# Patient Record
Sex: Female | Born: 1945 | Race: Black or African American | Hispanic: No | Smoking: Never smoker
Health system: Southern US, Community
[De-identification: ages and names within clinical notes are randomized; demographics above are authoritative.]

## PROBLEM LIST (undated history)

## (undated) DIAGNOSIS — M5416 Radiculopathy, lumbar region: Secondary | ICD-10-CM

## (undated) DIAGNOSIS — R519 Headache, unspecified: Secondary | ICD-10-CM

## (undated) DIAGNOSIS — E119 Type 2 diabetes mellitus without complications: Secondary | ICD-10-CM

## (undated) DIAGNOSIS — I10 Essential (primary) hypertension: Secondary | ICD-10-CM

## (undated) DIAGNOSIS — R51 Headache: Secondary | ICD-10-CM

## (undated) DIAGNOSIS — K5790 Diverticulosis of intestine, part unspecified, without perforation or abscess without bleeding: Secondary | ICD-10-CM

## (undated) DIAGNOSIS — G8929 Other chronic pain: Secondary | ICD-10-CM

## (undated) DIAGNOSIS — M549 Dorsalgia, unspecified: Secondary | ICD-10-CM

## (undated) HISTORY — PX: COLONOSCOPY: SHX174

## (undated) HISTORY — PX: ABDOMINAL HYSTERECTOMY: SHX81

## (undated) HISTORY — PX: THROAT SURGERY: SHX803

---

## 2003-12-19 ENCOUNTER — Emergency Department (HOSPITAL_COMMUNITY): Admission: EM | Admit: 2003-12-19 | Discharge: 2003-12-19 | Payer: Self-pay | Admitting: Emergency Medicine

## 2004-08-28 ENCOUNTER — Emergency Department (HOSPITAL_COMMUNITY): Admission: EM | Admit: 2004-08-28 | Discharge: 2004-08-28 | Payer: Self-pay | Admitting: Emergency Medicine

## 2004-09-22 ENCOUNTER — Ambulatory Visit (HOSPITAL_COMMUNITY): Admission: RE | Admit: 2004-09-22 | Discharge: 2004-09-22 | Payer: Self-pay | Admitting: Family Medicine

## 2004-09-26 ENCOUNTER — Encounter: Payer: Self-pay | Admitting: Orthopedic Surgery

## 2004-10-07 ENCOUNTER — Encounter: Payer: Self-pay | Admitting: Orthopedic Surgery

## 2010-08-19 ENCOUNTER — Telehealth: Payer: Self-pay

## 2010-08-19 DIAGNOSIS — Z139 Encounter for screening, unspecified: Secondary | ICD-10-CM

## 2010-08-19 NOTE — Telephone Encounter (Signed)
OK for colonoscopy w/ RMR 

## 2010-08-19 NOTE — Telephone Encounter (Signed)
Gastroenterology Pre-Procedure Form  Request Date: 09/03/2009    Requesting Physician: Ninfa Linden, FNP     PATIENT INFORMATION:  Angela Grimes is a 65 y.o., female (DOB=[birth year]).  PROCEDURE: Procedure(s) requested: colonoscopy Procedure Reason: screening for colon cancer  PATIENT REVIEW QUESTIONS: The patient reports the following:   1. Diabetes Melitis: no 2. Joint replacements in the past 12 months: no 3. Major health problems in the past 3 months: no 4. Has an artificial valve or MVP:no 5. Has been advised in past to take antibiotics in advance of a procedure like teeth cleaning: no}     MEDICATIONS & ALLERGIES:    Patient reports the following regarding taking any blood thinners:  Plavix? no ASA  Yes Coumadin?  no  Patient confirms/reports the following medications:  Current Outpatient Prescriptions  Medication Sig Dispense Refill  . aspirin 81 MG tablet Take 81 mg by mouth daily.        Marland Kitchen losartan (COZAAR) 100 MG tablet Take 100 mg by mouth daily.          Patient confirms/reports the following allergies:  No Known Allergies  Patient is appropriate to schedule for requested procedure(s): yes  AUTHORIZATION INFORMATION Primary Insurance: Medicare     ID #: [id number] Pre-Cert / Nelda Bucks Insurance: [employer]   ID #: [id number]    Pre-Cert / Berkley Harvey required: Pre-Cert / Auth #:   Orders Placed This Encounter  Procedures  . Endoscopy, colon, diagnostic    Standing Status: Future     Number of Occurrences:      Standing Expiration Date: 08/19/2011    Order Specific Question:  Pre-op diagnosis    Answer:  Screening colonoscopy    Order Specific Question:  Pre-op visit required?    Answer:  No [0]    SCHEDULE INFORMATION: Procedure has been scheduled as follows:  Date: 08/26/2010   Time: 10:15 AM  Location: [facility] Short Stay  This Gastroenterology Pre-Precedure Form is being routed to the following provider(s) for review: R. Roetta Sessions, MD

## 2010-08-26 ENCOUNTER — Other Ambulatory Visit: Payer: Self-pay | Admitting: Internal Medicine

## 2010-08-26 ENCOUNTER — Ambulatory Visit (HOSPITAL_COMMUNITY)
Admission: RE | Admit: 2010-08-26 | Discharge: 2010-08-26 | Disposition: A | Discharge status: Home / Self Care | Payer: Medicare PPO | Source: Ambulatory Visit | Attending: Internal Medicine | Admitting: Internal Medicine

## 2010-08-26 ENCOUNTER — Encounter: Payer: Medicare PPO | Admitting: Internal Medicine

## 2010-08-26 DIAGNOSIS — Z79899 Other long term (current) drug therapy: Secondary | ICD-10-CM | POA: Insufficient documentation

## 2010-08-26 DIAGNOSIS — Z1211 Encounter for screening for malignant neoplasm of colon: Secondary | ICD-10-CM

## 2010-08-26 DIAGNOSIS — D126 Benign neoplasm of colon, unspecified: Secondary | ICD-10-CM | POA: Insufficient documentation

## 2010-08-26 DIAGNOSIS — I10 Essential (primary) hypertension: Secondary | ICD-10-CM | POA: Insufficient documentation

## 2010-08-26 DIAGNOSIS — K5730 Diverticulosis of large intestine without perforation or abscess without bleeding: Secondary | ICD-10-CM

## 2010-08-26 DIAGNOSIS — Z7982 Long term (current) use of aspirin: Secondary | ICD-10-CM | POA: Insufficient documentation

## 2010-08-26 DIAGNOSIS — K644 Residual hemorrhoidal skin tags: Secondary | ICD-10-CM | POA: Insufficient documentation

## 2010-09-01 ENCOUNTER — Encounter: Payer: Self-pay | Admitting: Internal Medicine

## 2010-09-22 NOTE — Op Note (Signed)
  NAME:  GEARLDINE, LOONEY                 ACCOUNT NO.:  [id number]  MEDICAL RECORD NO.:  [id number]  LOCATION:  [facility]                          FACILITY:  [facility]  PHYSICIAN:  R. Roetta Sessions, MD FACP FACGDATE OF BIRTH:  11-02-1945  DATE OF PROCEDURE: DATE OF DISCHARGE:                              OPERATIVE REPORT   PROCEDURE:  Colonoscopy with snare polypectomy.  INDICATIONS FOR PROCEDURE:  A 65 year old lady referred at the courtesy of Ninfa Linden, FNP for her first ever screening colonoscopy.  No lower GI tract symptoms.  No family history of polyps or colon cancer. Colonoscopy is now being done as screening maneuver.  Risks, benefits, limitations, alternatives, imponderables have been reviewed, questions answered.  All parties agreeable.  PROCEDURE NOTE:  O2 saturation, blood pressure, pulse, respirations were monitored throughout the entirety of the procedure.  CONSCIOUS SEDATION:  Versed 5 mg IV, Demerol 75 mg IV in divided doses.  INSTRUMENT:  Pentax video chip system.  FINDINGS:  Digital rectal exam revealed only one external hemorrhoidal tag, otherwise negative.  Endoscopic findings:  Prep was adequate. Colon:  Colonic mucosa was surveyed from the rectosigmoid junction through the left transverse right colon to the appendiceal orifice, ileocecal valve/cecum.  Cecum was deeply intubated.  Cecum, ileocecal valve, appendiceal orifice were well seen and  photographed for the record.  From this level, scope was slowly withdrawn.  All previously mentioned mucosal surfaces were again seen.  The patient was found to have a 1-cm pedunculated polyp opposite the ileocecal valve which was hot snared, recovered through the scope.  The patient had scattered left- sided diverticula.  The remainder of colonic mucosa appeared normal. Scope was pulled down the rectum where a thorough examination of rectal mucosa including retroflexed view of the anal verge demonstrated no abnormalities.   The patient tolerated the procedure well.  Cecal withdrawal time 11 minutes.  IMPRESSION: 1. Single external hemorrhoidal tag otherwise normal rectum. 2. Left-sided diverticula, pedunculated polyp opposite the ileocecal     valve status post hot snare polypectomy.  RECOMMENDATIONS: 1. Diverticulosis and polyp literature provided to Ms. Orren. 2. No aspirin or arthritis medications for 5 days. 3. Followup on path. 4. Further recommendations to follow.     Jonathon Bellows, MD Caleen Essex     RMR/MEDQ  D:  08/26/2010  T:  08/26/2010  Job:  [id number]  cc:   Ninfa Linden, FNP Fax: [phone]  Electronically Signed by Lorrin Goodell M.D. on 09/22/2010 09:19:11 AM

## 2013-08-03 ENCOUNTER — Encounter (HOSPITAL_COMMUNITY): Payer: Self-pay | Admitting: Emergency Medicine

## 2013-08-03 ENCOUNTER — Emergency Department (HOSPITAL_COMMUNITY): Payer: Medicare PPO

## 2013-08-03 ENCOUNTER — Emergency Department (HOSPITAL_COMMUNITY)
Admission: EM | Admit: 2013-08-03 | Discharge: 2013-08-04 | Disposition: A | Discharge status: Home / Self Care | Payer: Medicare PPO | Attending: Emergency Medicine | Admitting: Emergency Medicine

## 2013-08-03 DIAGNOSIS — E119 Type 2 diabetes mellitus without complications: Secondary | ICD-10-CM | POA: Insufficient documentation

## 2013-08-03 DIAGNOSIS — R519 Headache, unspecified: Secondary | ICD-10-CM

## 2013-08-03 DIAGNOSIS — Z79899 Other long term (current) drug therapy: Secondary | ICD-10-CM | POA: Insufficient documentation

## 2013-08-03 DIAGNOSIS — Z7982 Long term (current) use of aspirin: Secondary | ICD-10-CM | POA: Insufficient documentation

## 2013-08-03 DIAGNOSIS — R51 Headache: Secondary | ICD-10-CM | POA: Insufficient documentation

## 2013-08-03 DIAGNOSIS — I10 Essential (primary) hypertension: Secondary | ICD-10-CM | POA: Insufficient documentation

## 2013-08-03 HISTORY — DX: Essential (primary) hypertension: I10

## 2013-08-03 HISTORY — DX: Type 2 diabetes mellitus without complications: E11.9

## 2013-08-03 NOTE — ED Notes (Signed)
I woke up this morning around 8 am with a pain in the top of my head. Localized to one area, strange sensation per pt. Denies numbness, no nausea per pt.

## 2013-08-03 NOTE — ED Notes (Signed)
Pt reports a nagging pain that has been on & off all day. Pt rates pain at current a 2/10. Pt WNL for neuro assessment

## 2013-08-03 NOTE — ED Provider Notes (Signed)
CSN: [id number]     Arrival date & time 08/03/13  2236 History   First MD Initiated Contact with Patient 08/03/13 2318 This chart was scribed for Johnna Acosta, MD by Anastasia Pall, ED Scribe. This patient was seen in room APA14/[facility]14 and the patient's care was started at 11:26 PM.     Chief Complaint  Patient presents with  . Head Pain    (Consider location/radiation/quality/duration/timing/severity/associated sxs/prior Treatment) The history is provided by the patient. No language interpreter was used.   HPI Comments: Angela Grimes is a 68 y.o. female who presents to the Emergency Department complaining of constant, dull, throbbing headache, onset 8:00 AM this morning. She denies her pain radiating to her neck or shoulders. She denies h/o similar pain. She states her mother had h/o atraumatic ICH from brain aneurysm that burst causing death in the past. She denies blurry vision, nausea, palpitations, difficulty breathing, dysuria, diarrhea, rashes, difficulty walking, loss of coordination, recent head trauma, MVCs, and new medications. Nothing makes this better or worse.  She states that the sx are mild - she classifies this as a dull throbbing but declines pain medicine stating that it is uncomfortable but no painful.  She is very concerned about aneurysm given her family history of brain aneurysm.  PCP - Erma Pinto, FNP  Past Medical History  Diagnosis Date  . Diabetes mellitus without complication   . Hypertension    Past Surgical History  Procedure Laterality Date  . Abdominal hysterectomy     No family history on file. History  Substance Use Topics  . Smoking status: Never Smoker   . Smokeless tobacco: Not on file  . Alcohol Use: No   OB History   Grav Para Term Preterm Abortions TAB SAB Ect Mult Living                 Review of Systems  All other systems reviewed and are negative.  Allergies  Review of patient's allergies indicates no known allergies.  Home  Medications   Prior to Admission medications   Medication Sig Start Date End Date Taking? Authorizing Provider  aspirin 81 MG tablet Take 81 mg by mouth daily.     Yes Historical Provider, MD  losartan (COZAAR) 100 MG tablet Take 100 mg by mouth daily.     Yes Historical Provider, MD   BP 160/87  Pulse 77  Temp(Src) 98.2 F (36.8 C) (Oral)  Resp 18  Ht 5\' 3"  (1.6 m)  Wt 194 lb (87.998 kg)  BMI 34.37 kg/m2  SpO2 95% Physical Exam  Nursing note and vitals reviewed. Constitutional: She is oriented to person, place, and time. She appears well-developed and well-nourished. No distress.  HENT:  Head: Normocephalic and atraumatic.  Mouth/Throat: Oropharynx is clear and moist.  Eyes: Conjunctivae and EOM are normal. Pupils are equal, round, and reactive to light.  Neck: Neck supple. No tracheal deviation present.  Cardiovascular: Normal rate, regular rhythm, normal heart sounds and intact distal pulses.  Exam reveals no gallop and no friction rub.   No murmur heard. Pulmonary/Chest: Effort normal and breath sounds normal. No respiratory distress. She has no wheezes. She has no rales.  Musculoskeletal: Normal range of motion. She exhibits no edema and no tenderness.  Neurological: She is alert and oriented to person, place, and time.  Neurologic exam:  Speech clear, pupils equal round reactive to light, extraocular movements intact  Normal peripheral visual fields Cranial nerves III through XII normal including no facial  droop Follows commands, moves all extremities x4, normal strength to bilateral upper and lower extremities at all major muscle groups including grip Sensation normal to light touch and pinprick Coordination intact, no limb ataxia, finger-nose-finger normal Rapid alternating movements normal No pronator drift Gait normal   Skin: Skin is warm and dry.  Psychiatric: She has a normal mood and affect. Her behavior is normal.   ED Course  Procedures (including critical  care time)  DIAGNOSTIC STUDIES: Oxygen Saturation is 95% on room air, adequate by my interpretation.    COORDINATION OF CARE: 11:30 PM-Discussed treatment plan with pt at bedside and pt agreed to plan.   Labs Review Labs Reviewed - No data to display  Imaging Review Ct Head Wo Contrast  08/04/2013   CLINICAL DATA:  Strange sensation at the crown of the head.  EXAM: CT HEAD WITHOUT CONTRAST  TECHNIQUE: Contiguous axial images were obtained from the base of the skull through the vertex without intravenous contrast.  COMPARISON:  None.  FINDINGS: There is no evidence of acute infarction, mass lesion, or intra- or extra-axial hemorrhage on CT.  The posterior fossa, including the cerebellum, brainstem and fourth ventricle, is within normal limits. The third and lateral ventricles, and basal ganglia are unremarkable in appearance. The cerebral hemispheres are symmetric in appearance, with normal gray-white differentiation. No mass effect or midline shift is seen.  There is no evidence of fracture; visualized osseous structures are unremarkable in appearance. The visualized portions of the orbits are within normal limits. The paranasal sinuses and mastoid air cells are well-aerated. No significant soft tissue abnormalities are seen.  IMPRESSION: Unremarkable noncontrast CT of the head.   Electronically Signed   By: Garald Balding M.D.   On: 08/04/2013 01:35     Medications - No data to display MDM   Final diagnoses:  Headache    The pt has uncomfortable sensation that is very concerning to her - at this time a CT of the head without contrast will be performed to look for possible bleeding though I think this is less likely - she appears very comfortable, she has no focal neurological findings and otherwise appears benign.  She declines pain medicine on initial evaluation.  CT normal, stable for d/c   I personally performed the services described in this documentation, which was scribed in my  presence. The recorded information has been reviewed and is accurate.      Johnna Acosta, MD 08/04/13 [phone]

## 2013-08-04 NOTE — Discharge Instructions (Signed)
CT scan is normal! No signs of bleeding - an MRI is required to fully evaluate for aneurysm.  Take tylenol for mild pains.

## 2013-08-04 NOTE — ED Notes (Signed)
Pt alert & oriented x4, stable gait. Patient given discharge instructions, paperwork & prescription(s). Patient  instructed to stop at the registration desk to finish any additional paperwork. Patient verbalized understanding. Pt left department w/ no further questions. 

## 2013-12-31 ENCOUNTER — Encounter (HOSPITAL_COMMUNITY): Payer: Self-pay | Admitting: Emergency Medicine

## 2013-12-31 ENCOUNTER — Emergency Department (HOSPITAL_COMMUNITY)
Admission: EM | Admit: 2013-12-31 | Discharge: 2013-12-31 | Disposition: A | Discharge status: Home / Self Care | Payer: Medicare PPO | Attending: Emergency Medicine | Admitting: Emergency Medicine

## 2013-12-31 DIAGNOSIS — H1132 Conjunctival hemorrhage, left eye: Secondary | ICD-10-CM | POA: Diagnosis not present

## 2013-12-31 DIAGNOSIS — H578 Other specified disorders of eye and adnexa: Secondary | ICD-10-CM | POA: Diagnosis present

## 2013-12-31 DIAGNOSIS — I10 Essential (primary) hypertension: Secondary | ICD-10-CM | POA: Diagnosis not present

## 2013-12-31 DIAGNOSIS — Z7982 Long term (current) use of aspirin: Secondary | ICD-10-CM | POA: Diagnosis not present

## 2013-12-31 DIAGNOSIS — R05 Cough: Secondary | ICD-10-CM | POA: Insufficient documentation

## 2013-12-31 DIAGNOSIS — R51 Headache: Secondary | ICD-10-CM | POA: Insufficient documentation

## 2013-12-31 DIAGNOSIS — Z79899 Other long term (current) drug therapy: Secondary | ICD-10-CM | POA: Insufficient documentation

## 2013-12-31 DIAGNOSIS — E119 Type 2 diabetes mellitus without complications: Secondary | ICD-10-CM | POA: Insufficient documentation

## 2013-12-31 MED ORDER — ERYTHROMYCIN 5 MG/GM OP OINT: TOPICAL_OINTMENT | OPHTHALMIC | Status: DC

## 2013-12-31 NOTE — Discharge Instructions (Signed)
Subconjunctival Hemorrhage °A subconjunctival hemorrhage is a bright red patch covering a portion of the white of the eye. The white part of the eye is called the sclera, and it is covered by a thin membrane called the conjunctiva. This membrane is clear, except for tiny blood vessels that you can see with the naked eye. When your eye is irritated or inflamed and becomes red, it is because the vessels in the conjunctiva are swollen. °Sometimes, a blood vessel in the conjunctiva can break and bleed. When this occurs, the blood builds up between the conjunctiva and the sclera, and spreads out to create a red area. The red spot may be very small at first. It may then spread to cover a larger part of the surface of the eye, or even all of the visible white part of the eye. °In almost all cases, the blood will go away and the eye will become white again. Before completely dissolving, however, the red area may spread. It may also become brownish-yellow in color before going away. If a lot of blood collects under the conjunctiva, it may look like a bulge on the surface of the eye. This looks scary, but it will also eventually flatten out and go away. Subconjunctival hemorrhages do not cause pain, but if swollen, may cause a feeling of irritation. There is no effect on vision.  °CAUSES  °· The most common cause is mild trauma (rubbing the eye, irritation). °· Subconjunctival hemorrhages can happen because of coughing or straining (lifting heavy objects), vomiting, or sneezing. °· In some cases, your doctor may want to check your blood pressure. High blood pressure can also cause a subconjunctival hemorrhage. °· Severe trauma or blunt injuries. °· Diseases that affect blood clotting (hemophilia, leukemia). °· Abnormalities of blood vessels behind the eye (carotid cavernous sinus fistula). °· Tumors behind the eye. °· Certain drugs (aspirin, Coumadin, heparin). °· Recent eye surgery. °HOME CARE INSTRUCTIONS  °· Do not worry  about the appearance of your eye. You may continue your usual activities. °· Often, follow-up is not necessary. °SEEK MEDICAL CARE IF:  °· Your eye becomes painful. °· The bleeding does not disappear within 3 weeks. °· Bleeding occurs elsewhere, for example, under the skin, in the mouth, or in the other eye. °· You have recurring subconjunctival hemorrhages. °SEEK IMMEDIATE MEDICAL CARE IF:  °· Your vision changes or you have difficulty seeing. °· You develop a severe headache, persistent vomiting, confusion, or abnormal drowsiness (lethargy). °· Your eye seems to bulge or protrude from the eye socket. °· You notice the sudden appearance of bruises or have spontaneous bleeding elsewhere on your body. °Document Released: 02/23/2005 Document Revised: 07/10/2013 Document Reviewed: 01/21/2009 °Exit[employer]® Patient Information ©2015 [employer]. This information is not intended to replace advice given to you by your health care provider. Make sure you discuss any questions you have with your health care provider. ° °

## 2013-12-31 NOTE — ED Provider Notes (Signed)
CSN: [id number]     Arrival date & time 12/31/13  7106 History   This chart was scribed for Dorie Rank, MD by Hilda Lias, ED Scribe. This patient was seen in room APA04/APA04 and the patient's care was started at 7:54 AM.  The history is provided by the patient. No language interpreter was used.    HPI Comments: Angela Grimes is a 68 y.o. female who presents to the Emergency Department complaining of constant eye irritation and redness that began 2 days ago. Pt notes two nights ago she had a bad cough and headache after which redness began after the coughing. Pt notes having cold-like symptoms recently. Pt denies using contacts. Pt is not a smoker, does not drink, and does not do any illegal drugs. She denies eye pain, eye drainage, or fever. No headache now.  Her vision is normal. Past Medical History  Diagnosis Date  . Diabetes mellitus without complication   . Hypertension    Past Surgical History  Procedure Laterality Date  . Abdominal hysterectomy     No family history on file. History  Substance Use Topics  . Smoking status: Never Smoker   . Smokeless tobacco: Not on file  . Alcohol Use: No   OB History   Grav Para Term Preterm Abortions TAB SAB Ect Mult Living                 Review of Systems  Constitutional: Negative for fever.  Eyes: Positive for redness. Negative for pain and discharge.  Respiratory: Positive for cough.   Neurological: Positive for headaches.    A complete 10 system review of systems was obtained and all systems are negative except as noted in the HPI and PMH.    Allergies  Review of patient's allergies indicates no known allergies.  Home Medications   Prior to Admission medications   Medication Sig Start Date End Date Taking? Authorizing Provider  aspirin 81 MG tablet Take 81 mg by mouth daily.      Historical Provider, MD  losartan (COZAAR) 100 MG tablet Take 100 mg by mouth daily.      Historical Provider, MD   There were no vitals  taken for this visit. Physical Exam  Nursing note and vitals reviewed. Constitutional: She appears well-developed and well-nourished. No distress.  HENT:  Head: Normocephalic and atraumatic.  Right Ear: External ear normal.  Left Ear: External ear normal.  Eyes: EOM are normal. Right eye exhibits no chemosis, no discharge and no exudate. Left eye exhibits no chemosis, no discharge and no exudate. Right conjunctiva is not injected. Right conjunctiva has no hemorrhage. Left conjunctiva has a hemorrhage. No scleral icterus.  Neck: Neck supple. No tracheal deviation present.  Cardiovascular: Normal rate, regular rhythm and intact distal pulses.   Pulmonary/Chest: Effort normal and breath sounds normal. No stridor. No respiratory distress. She has no wheezes. She has no rales.  Abdominal: Soft. Bowel sounds are normal. She exhibits no distension. There is no tenderness. There is no rebound and no guarding.  Musculoskeletal: She exhibits no edema and no tenderness.  Neurological: She is alert. She has normal strength. No cranial nerve deficit (no facial droop, extraocular movements intact, no slurred speech) or sensory deficit. She exhibits normal muscle tone. She displays no seizure activity. Coordination normal.  Skin: Skin is warm and dry. No rash noted.  Psychiatric: She has a normal mood and affect.    ED Course  Procedures (including critical care time)  DIAGNOSTIC STUDIES:  Oxygen Saturation is 97% on RA, normal by my interpretation.    COORDINATION OF CARE: 7:59 AM Discussed treatment plan with pt at bedside and pt agreed to plan.   MDM   Final diagnoses:  Subconjunctival hemorrhage of left eye    The patient's exam is consistent with a subconjunctival hemorrhage. Most likely related to the coughing spell she had. Doubt iritis, conjunctivitis.  Opthalmic ointment for symptomatic relief.  I personally performed the services described in this documentation, which was scribed in my  presence.  The recorded information has been reviewed and is accurate.    Dorie Rank, MD 12/31/13 [phone]

## 2013-12-31 NOTE — ED Notes (Signed)
Patient c/o left eye irritation. Patient's sclera red. Denies pain or drainage. Per patient started redness started in corner of eye and progressively gotten worse. Patient denies any blurred vision.

## 2014-01-08 ENCOUNTER — Encounter (HOSPITAL_COMMUNITY): Payer: Self-pay | Admitting: Emergency Medicine

## 2014-05-09 ENCOUNTER — Emergency Department (HOSPITAL_COMMUNITY): Payer: Medicare HMO

## 2014-05-09 ENCOUNTER — Emergency Department (HOSPITAL_COMMUNITY)
Admission: EM | Admit: 2014-05-09 | Discharge: 2014-05-09 | Disposition: A | Discharge status: Home / Self Care | Payer: Medicare HMO | Attending: Emergency Medicine | Admitting: Emergency Medicine

## 2014-05-09 ENCOUNTER — Encounter (HOSPITAL_COMMUNITY): Payer: Self-pay | Admitting: Emergency Medicine

## 2014-05-09 DIAGNOSIS — R002 Palpitations: Secondary | ICD-10-CM | POA: Diagnosis not present

## 2014-05-09 DIAGNOSIS — Z8719 Personal history of other diseases of the digestive system: Secondary | ICD-10-CM | POA: Insufficient documentation

## 2014-05-09 DIAGNOSIS — Z79899 Other long term (current) drug therapy: Secondary | ICD-10-CM | POA: Insufficient documentation

## 2014-05-09 DIAGNOSIS — I10 Essential (primary) hypertension: Secondary | ICD-10-CM | POA: Diagnosis not present

## 2014-05-09 DIAGNOSIS — E119 Type 2 diabetes mellitus without complications: Secondary | ICD-10-CM | POA: Diagnosis not present

## 2014-05-09 DIAGNOSIS — G8929 Other chronic pain: Secondary | ICD-10-CM | POA: Insufficient documentation

## 2014-05-09 DIAGNOSIS — Z7982 Long term (current) use of aspirin: Secondary | ICD-10-CM | POA: Diagnosis not present

## 2014-05-09 DIAGNOSIS — Z8739 Personal history of other diseases of the musculoskeletal system and connective tissue: Secondary | ICD-10-CM | POA: Insufficient documentation

## 2014-05-09 HISTORY — DX: Headache, unspecified: R51.9

## 2014-05-09 HISTORY — DX: Radiculopathy, lumbar region: M54.16

## 2014-05-09 HISTORY — DX: Dorsalgia, unspecified: M54.9

## 2014-05-09 HISTORY — DX: Headache: R51

## 2014-05-09 HISTORY — DX: Other chronic pain: G89.29

## 2014-05-09 HISTORY — DX: Diverticulosis of intestine, part unspecified, without perforation or abscess without bleeding: K57.90

## 2014-05-09 LAB — BASIC METABOLIC PANEL
Anion gap: 6 (ref 5–15)
BUN: 17 mg/dL (ref 6–23)
CO2: 28 mmol/L (ref 19–32)
Calcium: 9.5 mg/dL (ref 8.4–10.5)
Chloride: 105 mmol/L (ref 96–112)
Creatinine, Ser: 0.78 mg/dL (ref 0.50–1.10)
GFR calc Af Amer: >90 mL/min (ref >90)
GFR calc non Af Amer: 83 mL/min — ABNORMAL LOW (ref >90)
Glucose, Bld: 136 mg/dL — ABNORMAL HIGH (ref 70–99)
Potassium: 4.2 mmol/L (ref 3.5–5.1)
Sodium: 139 mmol/L (ref 135–145)

## 2014-05-09 LAB — CBC WITH DIFFERENTIAL/PLATELET
Basophils Absolute: 0 10*3/uL (ref 0.0–0.1)
Basophils Relative: 0 % (ref 0–1)
Eosinophils Absolute: 0 10*3/uL (ref 0.0–0.7)
Eosinophils Relative: 0 % (ref 0–5)
HCT: 37.2 % (ref 36.0–46.0)
Hemoglobin: 11.8 g/dL — ABNORMAL LOW (ref 12.0–15.0)
Lymphocytes Relative: 20 % (ref 12–46)
Lymphs Abs: 1.8 10*3/uL (ref 0.7–4.0)
MCH: 27.2 pg (ref 26.0–34.0)
MCHC: 31.7 g/dL (ref 30.0–36.0)
MCV: 85.7 fL (ref 78.0–100.0)
Monocytes Absolute: 0.4 10*3/uL (ref 0.1–1.0)
Monocytes Relative: 5 % (ref 3–12)
Neutro Abs: 6.4 10*3/uL (ref 1.7–7.7)
Neutrophils Relative %: 75 % (ref 43–77)
Platelets: 363 10*3/uL (ref 150–400)
RBC: 4.34 MIL/uL (ref 3.87–5.11)
RDW: 14.3 % (ref 11.5–15.5)
WBC: 8.6 10*3/uL (ref 4.0–10.5)

## 2014-05-09 LAB — TROPONIN I: Troponin I: <0.03 ng/mL (ref <0.031)

## 2014-05-09 NOTE — Discharge Instructions (Signed)
°Emergency Department Resource Guide °1) Find a Doctor and Pay Out of Pocket °Although you won't have to find out who is covered by your insurance plan, it is a good idea to ask around and get recommendations. You will then need to call the office and see if the doctor you have chosen will accept you as a new patient and what types of options they offer for patients who are self-pay. Some doctors offer discounts or will set up payment plans for their patients who do not have insurance, but you will need to ask so you aren't surprised when you get to your appointment. ° °2) Contact Your Local Health Department °Not all health departments have doctors that can see patients for sick visits, but many do, so it is worth a call to see if yours does. If you don't know where your local health department is, you can check in your phone book. The CDC also has a tool to help you locate your state's health department, and many state websites also have listings of all of their local health departments. ° °3) Find a Walk-in Clinic °If your illness is not likely to be very severe or complicated, you may want to try a walk in clinic. These are popping up all over the country in pharmacies, drugstores, and shopping centers. They're usually staffed by nurse practitioners or physician assistants that have been trained to treat common illnesses and complaints. They're usually fairly quick and inexpensive. However, if you have serious medical issues or chronic medical problems, these are probably not your best option. ° °No Primary Care Doctor: °- Call [facility] at  832-8000 - they can help you locate a primary care doctor that  accepts your insurance, provides certain services, etc. °- Physician Referral Service- 1-800-533-3463 ° °Chronic Pain Problems: °Organization         Address  Phone   Notes  °[facility]  (336) 297-2271 Patients need to be referred by their primary care doctor.  ° °Medication  Assistance: °Organization         Address  Phone   Notes  °[place] [facility] [address] °[place], [place] [address] (336) 641-8030 --Must be a resident of [place] °-- Must have NO insurance coverage whatsoever (no Medicaid/ Medicare, etc.) °-- The pt. MUST have a primary care doctor that directs their care regularly and follows them in the community °  °[facility]  (866) 331-1348   °[employer]  (888) 892-1162   ° °Agencies that provide inexpensive medical care: °Organization         Address  Phone   Notes  °[facility]  (336) 832-8035   °[facility]    (336) 832-7272   °[facility] [address] °[place], [place] [address] (336) 832-4777   °[facility] [address], °[place] (336) 271-4999   °[facility]    (336) 373-0678   °[facility]    (336) 272-1050   °[facility] ° [address], [place] Phone:  (336) 832-4444, Fax:  (336) 832-4440 Hours of Operation:  9 am - 6 pm, M-F.  Also accepts Medicaid/Medicare and self-pay.  °[facility] ° [address],  Phone: (336) 832-3150, Fax: (336) 832-3151. Hours of Operation:  8:30 am - 5:30 pm, M-F.  Also accepts Medicaid and self-pay.  °[facility] [place] [address], [place] Phone: (336) 878-6027   °[facility] [address], [place], [place] (336)723-1848, Ext. 123 Mondays & Thursdays: 7-9 AM.  First 15 patients are seen on a first come, first serve basis. °  ° °Medicaid-accepting [place] Providers: ° °Organization         Address  Phone   Notes  °[facility] [address], [place] (336) 641-2100 Also accepts self-pay patients.  °[facility] [address], [place] ° (336) 856-9996   °[facility] [address], [place]  (336) 288-8857   °[facility] [address] [place] [address], [place] (336) 299-7000   °Veita Bland [address], [place]  ° (336) 373-1557 Only accepts [place] Access Medicaid patients after they have their name applied to their card.  ° °Self-Pay (no insurance) in [place]: ° °Organization         Address  Phone   Notes  °Sickle Cell Patients, [facility] [address], [place] (336) 832-1970   °[facility] [address], [place] (336) 832-4400   °[facility] [place] ° [address], [place] (336) 992-4800   °[facility]/Dr. Osei-Bonsu ° [address] [place] [address], [place] or [address], [place] (336) 841-8500 Phone number for both [place] and [place] locations is the same.  °[facility] [address], [place] (336) 299-0000   °[facility] [address] [place] [address], [place] or [address] (336) 852-7530 °(336) 878-2260   °[facility] [address], [place] (336) 350-1642, phone; (336) 294-5005, fax Sees patients 1st and 3rd Saturday of every month.  Must not qualify for public or private insurance (i.e. Medicaid, Medicare, [place] Health Choice, Veterans' Benefits) • Household income should be no more than 200% of the poverty level •The clinic cannot treat you if you are pregnant or think you are pregnant • Sexually transmitted diseases are not treated at the clinic.  ° ° °Dental Care: °Organization         Address  Phone  Notes  °[place] [facility] [address],  (336) 641-6152 Accepts children up to age 21 who are enrolled in Medicaid or [place] Health Choice; pregnant women with a Medicaid card; and children who have applied for Medicaid or [place] Health Choice, but were declined, whose parents can pay a reduced fee at time of service.  °[place]  [facility] [place]  [address], [place] (336) 641-7733 Accepts children up to age 21 who are enrolled in Medicaid or [place] Health Choice; pregnant women with a Medicaid card; and children who have applied for Medicaid or [place] Health Choice, but were declined, whose parents can pay a reduced fee at time of service.  °[facility] ° [address],  (336) 641-4533 Patients are seen by appointment only. Walk-ins are not accepted. [facility] will see patients 18 years of age and older. °Monday - Tuesday (8am-5pm) °Most Wednesdays (8:30-5pm) °$30 per visit, cash only  °[facility] ° [address], [place] (336) 641-4533 Patients are seen by appointment only. Walk-ins are not accepted. [facility] will see patients 18 years of age and older. °One   Wednesday Evening (Monthly: Volunteer Based).  $30 per visit, cash only  °[facility]  (919) 537-3737 for adults; Children under age 4, call Graduate Pediatric Dentistry at ([phone]) [phone]. Children aged 4-14, please call (919) [phone] to request a pediatric application. ° Dental services are provided in all areas of dental care including fillings, crowns and bridges, complete and partial dentures, implants, gum treatment, root canals, and extractions. Preventive care is also provided. Treatment is provided to both adults and children. °Patients are selected via a lottery and there is often a waiting list. °  °[facility] [address], °[place] ° (336) 763-8833 www.drcivils.com °  °[facility] [address], [place], [place] (336)723-1848, Ext. 123 Second and Fourth Thursday of each month, opens at 6:30 AM; Clinic ends at 9 AM.  Patients are seen on a first-come first-served basis, and a limited number are seen during each clinic.  ° °[facility] ° [address], [place], [place] (336) 723-7904    Eligibility Requirements °You must have lived in [place], [place], or [place] counties for at least the last three months. °  You cannot be eligible for state or federal sponsored healthcare insurance, including Veterans Administration, Medicaid, or Medicare. °  You generally cannot be eligible for healthcare insurance through your employer.  °  How to apply: °Eligibility screenings are held every Tuesday and Wednesday afternoon from 1:00 pm until 4:00 pm. You do not need an appointment for the interview!  °[facility] [address], [place], [place] 336-631-2330   °[facility]  336-342-8273   °[facility]  336-703-3100   °[facility]  336-570-6415   ° °Behavioral Health Resources in the Community: °Intensive Outpatient Programs °Organization         Address  Phone  Notes  °[facility] [address], [place], [place] 336-878-6098   °[facility] [address], [place], [place] 336-832-9800   °[facility] [address], [place], [place] ° 336-882-2125   °[facility] [address],  °[place], [place] 1-800-853-5163 or 336-641-4981   °Substance Abuse Resources °Organization         Address  Phone  Notes  °[facility]  336-882-2125   °[facility]  336-784-9470   °[facility]  336-285-9073   °[facility]  336-845-3988   °Residential & Outpatient Substance Abuse Program  1-800-659-3381   °Psychological Services °Organization         Address  Phone  Notes  °[facility]  336- 832-9600   °[facility]  336- 378-7881   °[facility] [address], [place] 1-800-853-5163 or 336-641-4981   ° °Mobile Crisis Teams °Organization         Address  Phone  Notes  °[facility], Mobile Crisis Care Unit  1-877-626-1772   °Assertive °[facility] ° [address]  [place], [place] 336-834-9664   °Sharon DeEsch [address] °[place] 336-554-5454   ° °Self-Help/Support Groups °Organization         Address  Phone             Notes  °[facility] - variety of support groups  336- 373-1402 Call for more information  °Narcotics Anonymous (NA), [facility] [address], °[place]   2 meetings at this location  ° °  Residential Treatment Programs Organization         Address  Phone  Notes  [facility] [address]    [place]  1-4380194982   [facility]  [address], [place] [address], [place], [place]   [place], [place] [phone] Admissions: 8am-3pm M-F  [facility] [place] [address]    [place], [place] 458-099-8338   [facility] [address] [place], [place], [place]   [facility] [address],  [place], [place]   [facility] Intensive Outpatient [address], [place], [place]   [facility] ([place]  [place], [place] 1-3430787325 or 414-209-9561   [facility] [address] [place], [place] Accepts Medicaid  [facility] [address],  [place] 1-848-365-7367 Substance Abuse/Addiction Treatment   [facility] Organization         Address  Phone  Notes  [facility]  585-438-2982   Domenic Schwab, PhD [address] [place], [place]   602-594-0543 or 825-194-6471   [place], [place] [phone]   [facility] [address], [place], [place] (725) 308-7603 Insurance/Medicaid/sponsorship through [facility] [address]                                    [place], [place] 971-626-0515 [place] [address], [place] ([phone]    Dr. Adele Schilder  ([phone]   [facility] Dept. 1) [address], [place] 2) [place] 3)  [address], [place] [phone]  [phone]   [place] (865)483-1734 or [phone] (After Hours)      Avoid avoid caffinated products, such as teas, colas, coffee, chocolate. Avoid over the counter cold medicines, herbal or "natural vitamin" products, and illicit drugs because they can contain stimulants.  Call your regular medical doctor and the Cardiologist tomorrow to schedule a follow up appointment within the next week.  Return to the Emergency Department immediately if worsening.

## 2014-05-09 NOTE — ED Provider Notes (Signed)
CSN: [id number]     Arrival date & time 05/09/14  1415 History   First MD Initiated Contact with Patient 05/09/14 1623     Chief Complaint  Patient presents with  . Palpitations     HPI Pt was seen at 1640. Per pt, c/o gradual onset and persistence of multiple intermittent episodes of "palpitations" for the past 3 months. Pt states they became worse "last week when I was visiting my daughter in the ICU." Pt describes the palpitations as "fast, pounding heart beat." Has been associated with "feeling like I'm having anxiety or a panic attack." Pt has not taken her heart rate during these episodes. States she had an episode PTA, but it "stopped on the way here."  Denies abd pain, no N/V/D, no SOB/cough, no CP, no syncope/near syncope, no focal motor weakness, no tingling/numbness in extremities.    Past Medical History  Diagnosis Date  . Diabetes mellitus without complication   . Hypertension   . Headache   . Diverticulosis   . Chronic back pain   . Lumbar radiculopathy    Past Surgical History  Procedure Laterality Date  . Abdominal hysterectomy    . Throat surgery     Family History  Problem Relation Age of Onset  . Stroke Mother   . Diabetes Other    History  Substance Use Topics  . Smoking status: Never Smoker   . Smokeless tobacco: Never Used  . Alcohol Use: No   OB History    Gravida Para Term Preterm AB TAB SAB Ectopic Multiple Living   9 8 8  1  1   7      Review of Systems ROS: Statement: All systems negative except as marked or noted in the HPI; Constitutional: Negative for fever and chills. ; ; Eyes: Negative for eye pain, redness and discharge. ; ; ENMT: Negative for ear pain, hoarseness, nasal congestion, sinus pressure and sore throat. ; ; Cardiovascular: +palpitations. Negative for chest pain, diaphoresis, dyspnea and peripheral edema. ; ; Respiratory: Negative for cough, wheezing and stridor. ; ; Gastrointestinal: Negative for nausea, vomiting, diarrhea,  abdominal pain, blood in stool, hematemesis, jaundice and rectal bleeding. . ; ; Genitourinary: Negative for dysuria, flank pain and hematuria. ; ; Musculoskeletal: Negative for back pain and neck pain. Negative for swelling and trauma.; ; Skin: Negative for pruritus, rash, abrasions, blisters, bruising and skin lesion.; ; Neuro: Negative for headache, lightheadedness and neck stiffness. Negative for weakness, altered level of consciousness , altered mental status, extremity weakness, paresthesias, involuntary movement, seizure and syncope.; Psych:  +anxiety. No SI, no SA, no HI, no hallucinations.      Allergies  Review of patient's allergies indicates no known allergies.  Home Medications   Prior to Admission medications   Medication Sig Start Date End Date Taking? Authorizing Provider  aspirin 81 MG tablet Take 81 mg by mouth daily.     Yes Historical Provider, MD  Calcium Carbonate Antacid (ANTACID PO) Take 1 tablet by mouth daily as needed (for acid reflux).   Yes Historical Provider, MD  losartan (COZAAR) 100 MG tablet Take 100 mg by mouth daily.     Yes Historical Provider, MD   BP 132/67 mmHg  Pulse 77  Temp(Src) 98.3 F (36.8 C) (Oral)  Resp 11  Ht 5\' 3"  (1.6 m)  Wt 180 lb (81.647 kg)  BMI 31.89 kg/m2  SpO2 100%   18:28 Orthostatic Vital Signs Orthostatic Lying  - BP- Lying: 137/68 mmHg ; Pulse-  Lying: 70  Orthostatic Sitting - BP- Sitting: 131/79 mmHg ; Pulse- Sitting: 74  Orthostatic Standing at 0 minutes - BP- Standing at 0 minutes: 128/78 mmHg ; Pulse- Standing at 0 minutes: 84      Physical Exam  1645: Physical examination:  Nursing notes reviewed; Vital signs and O2 SAT reviewed;  Constitutional: Well developed, Well nourished, Well hydrated, In no acute distress; Head:  Normocephalic, atraumatic; Eyes: EOMI, PERRL, No scleral icterus; ENMT: Mouth and pharynx normal, Mucous membranes moist; Neck: Supple, Full range of motion, No lymphadenopathy; Cardiovascular: Regular  rate and rhythm, No gallop; Respiratory: Breath sounds clear & equal bilaterally, No wheezes.  Speaking full sentences with ease, Normal respiratory effort/excursion; Chest: Nontender, Movement normal; Abdomen: Soft, Nontender, Nondistended, Normal bowel sounds; Genitourinary: No CVA tenderness; Extremities: Pulses normal, No tenderness, No edema, No calf edema or asymmetry.; Neuro: AA&Ox3, Major CN grossly intact.  Speech clear. No gross focal motor or sensory deficits in extremities.; Skin: Color normal, Warm, Dry.; Psych:  Anxious, tearful.    ED Course  Procedures     EKG Interpretation   Date/Time:  Wednesday May 09 2014 14:24:24 EST Ventricular Rate:  86 PR Interval:  160 QRS Duration: 82 QT Interval:  374 QTC Calculation: 447 R Axis:   57 Text Interpretation:  Normal sinus rhythm Normal ECG Confirmed by Lacinda Axon   MD, BRIAN ([id number]) on 05/09/2014 2:32:20 PM      MDM  MDM Reviewed: previous chart, nursing note and vitals Reviewed previous: labs and ECG Interpretation: labs, ECG and x-ray     Results for orders placed or performed during the hospital encounter of 05/09/14  CBC with Differential  Result Value Ref Range   WBC 8.6 4.0 - 10.5 K/uL   RBC 4.34 3.87 - 5.11 MIL/uL   Hemoglobin 11.8 (L) 12.0 - 15.0 g/dL   HCT 37.2 36.0 - 46.0 %   MCV 85.7 78.0 - 100.0 fL   MCH 27.2 26.0 - 34.0 pg   MCHC 31.7 30.0 - 36.0 g/dL   RDW 14.3 11.5 - 15.5 %   Platelets 363 150 - 400 K/uL   Neutrophils Relative % 75 43 - 77 %   Neutro Abs 6.4 1.7 - 7.7 K/uL   Lymphocytes Relative 20 12 - 46 %   Lymphs Abs 1.8 0.7 - 4.0 K/uL   Monocytes Relative 5 3 - 12 %   Monocytes Absolute 0.4 0.1 - 1.0 K/uL   Eosinophils Relative 0 0 - 5 %   Eosinophils Absolute 0.0 0.0 - 0.7 K/uL   Basophils Relative 0 0 - 1 %   Basophils Absolute 0.0 0.0 - 0.1 K/uL  Basic metabolic panel  Result Value Ref Range   Sodium 139 135 - 145 mmol/L   Potassium 4.2 3.5 - 5.1 mmol/L   Chloride 105 96 - 112 mmol/L    CO2 28 19 - 32 mmol/L   Glucose, Bld 136 (H) 70 - 99 mg/dL   BUN 17 6 - 23 mg/dL   Creatinine, Ser 0.78 0.50 - 1.10 mg/dL   Calcium 9.5 8.4 - 10.5 mg/dL   GFR calc non Af Amer 83 (L) >90 mL/min   GFR calc Af Amer >90 >90 mL/min   Anion gap 6 5 - 15  Troponin I  Result Value Ref Range   Troponin I <0.03 <0.031 ng/mL   Dg Chest 2 View 05/09/2014   CLINICAL DATA:  Palpitations and tachycardia.  EXAM: CHEST - 2 VIEW  COMPARISON:  None.  FINDINGS: The heart size is normal. Atherosclerotic calcifications are present at the aortic arch. The lungs are clear. Mild degenerative changes are noted at the [facility] joints.  IMPRESSION: 1. No acute cardiopulmonary disease. 2. Atherosclerosis.   Electronically Signed   By: San Morelle M.D.   On: 05/09/2014 17:32    1840:  Pt has ambulated with steady gait, resps easy, HR 88-92. Denies CP/palpitations. Pt is not orthostatic. No arrhythmia on monitor during ED visit.  Pt states she "feels better" and wants to go home now. Dx and testing d/w pt and family.  Questions answered.  Verb understanding, agreeable to d/c home with outpt f/u.   Francine Graven, DO 05/11/14 2340

## 2014-05-09 NOTE — ED Notes (Signed)
Pt reports onset of rapid heartbeat and palpitations this morning around 1100. Pt states symptoms have been intermittent. Pt denies CP, SOB or n/v.

## 2014-05-09 NOTE — ED Notes (Signed)
Pt ambulated well. HR:88-92 O2: 94-98

## 2014-07-05 ENCOUNTER — Encounter (HOSPITAL_COMMUNITY): Payer: Self-pay

## 2014-07-05 ENCOUNTER — Emergency Department (HOSPITAL_COMMUNITY)
Admission: EM | Admit: 2014-07-05 | Discharge: 2014-07-05 | Disposition: A | Discharge status: Home / Self Care | Payer: Medicare HMO | Attending: Emergency Medicine | Admitting: Emergency Medicine

## 2014-07-05 DIAGNOSIS — I10 Essential (primary) hypertension: Secondary | ICD-10-CM | POA: Insufficient documentation

## 2014-07-05 DIAGNOSIS — Z8719 Personal history of other diseases of the digestive system: Secondary | ICD-10-CM | POA: Insufficient documentation

## 2014-07-05 DIAGNOSIS — R42 Dizziness and giddiness: Secondary | ICD-10-CM

## 2014-07-05 DIAGNOSIS — Z7982 Long term (current) use of aspirin: Secondary | ICD-10-CM | POA: Insufficient documentation

## 2014-07-05 DIAGNOSIS — Z8739 Personal history of other diseases of the musculoskeletal system and connective tissue: Secondary | ICD-10-CM | POA: Diagnosis not present

## 2014-07-05 DIAGNOSIS — G8929 Other chronic pain: Secondary | ICD-10-CM | POA: Diagnosis not present

## 2014-07-05 DIAGNOSIS — Z79899 Other long term (current) drug therapy: Secondary | ICD-10-CM | POA: Diagnosis not present

## 2014-07-05 DIAGNOSIS — E119 Type 2 diabetes mellitus without complications: Secondary | ICD-10-CM | POA: Diagnosis not present

## 2014-07-05 DIAGNOSIS — R110 Nausea: Secondary | ICD-10-CM | POA: Insufficient documentation

## 2014-07-05 LAB — BASIC METABOLIC PANEL
Anion gap: 6 (ref 5–15)
BUN: 11 mg/dL (ref 6–23)
CO2: 28 mmol/L (ref 19–32)
Calcium: 8.8 mg/dL (ref 8.4–10.5)
Chloride: 105 mmol/L (ref 96–112)
Creatinine, Ser: 0.75 mg/dL (ref 0.50–1.10)
GFR calc Af Amer: >90 mL/min (ref >90)
GFR calc non Af Amer: 84 mL/min — ABNORMAL LOW (ref >90)
Glucose, Bld: 179 mg/dL — ABNORMAL HIGH (ref 70–99)
Potassium: 3.9 mmol/L (ref 3.5–5.1)
Sodium: 139 mmol/L (ref 135–145)

## 2014-07-05 LAB — CBC
HCT: 36.5 % (ref 36.0–46.0)
Hemoglobin: 11.5 g/dL — ABNORMAL LOW (ref 12.0–15.0)
MCH: 27.3 pg (ref 26.0–34.0)
MCHC: 31.5 g/dL (ref 30.0–36.0)
MCV: 86.7 fL (ref 78.0–100.0)
Platelets: 328 10*3/uL (ref 150–400)
RBC: 4.21 MIL/uL (ref 3.87–5.11)
RDW: 14.3 % (ref 11.5–15.5)
WBC: 7.1 10*3/uL (ref 4.0–10.5)

## 2014-07-05 LAB — CBG MONITORING, ED: Glucose-Capillary: 131 mg/dL — ABNORMAL HIGH (ref 70–99)

## 2014-07-05 MED ORDER — ONDANSETRON 8 MG PO TBDP: 8.0000 mg | ORAL_TABLET | Freq: Three times a day (TID) | ORAL | Status: DC | PRN

## 2014-07-05 MED ORDER — ONDANSETRON HCL 4 MG/2ML IJ SOLN
4.0000 mg | Freq: Once | INTRAMUSCULAR | Status: AC
  Administered 2014-07-05: 4 mg via INTRAVENOUS
  Filled 2014-07-05: qty 2

## 2014-07-05 MED ORDER — SODIUM CHLORIDE 0.9 % IV BOLUS (SEPSIS)
500.0000 mL | Freq: Once | INTRAVENOUS | Status: AC
  Administered 2014-07-05: 500 mL via INTRAVENOUS

## 2014-07-05 MED ORDER — DIAZEPAM 5 MG PO TABS
5.0000 mg | ORAL_TABLET | Freq: Once | ORAL | Status: AC
  Administered 2014-07-05: 5 mg via ORAL
  Filled 2014-07-05: qty 1

## 2014-07-05 MED ORDER — DIAZEPAM 5 MG PO TABS: 5.0000 mg | ORAL_TABLET | Freq: Three times a day (TID) | ORAL | Status: DC | PRN

## 2014-07-05 NOTE — ED Provider Notes (Signed)
CSN: [id number]     Arrival date & time 07/05/14  1725 History   First MD Initiated Contact with Patient 07/05/14 1734     Chief Complaint  Patient presents with  . Dizziness  . Nausea      The history is provided by the patient.   patient complains of a feeling of dizziness and the room spinning around which began abruptly at 4:00.  Made her nauseated.  She did not have any vomiting.  She denies weakness of her arms or legs.  No difficulty with her speech.  No slurred speech.  No abnormal facial symmetry noted by family.  Patient has a history of vertigo reports this feels similar.  She was unable to control her symptoms at home with meclizine and thus she came to the ER for evaluation.  She denies headache at this time.  Family reports no altered mental status.  She's been eating and drinking normally today week and has had no recent illness.    Past Medical History  Diagnosis Date  . Diabetes mellitus without complication   . Hypertension   . Headache   . Diverticulosis     pt denies  . Chronic back pain   . Lumbar radiculopathy    Past Surgical History  Procedure Laterality Date  . Abdominal hysterectomy    . Throat surgery     Family History  Problem Relation Age of Onset  . Stroke Mother   . Diabetes Other    History  Substance Use Topics  . Smoking status: Never Smoker   . Smokeless tobacco: Never Used  . Alcohol Use: No   OB History    Gravida Para Term Preterm AB TAB SAB Ectopic Multiple Living   9 8 8  1  1   7      Review of Systems  All other systems reviewed and are negative.     Allergies  Review of patient's allergies indicates no known allergies.  Home Medications   Prior to Admission medications   Medication Sig Start Date End Date Taking? Authorizing Provider  acetaminophen (TYLENOL) 500 MG tablet Take 500 mg by mouth every 6 (six) hours as needed.   Yes Historical Provider, MD  aspirin 81 MG tablet Take 81 mg by mouth daily.     Yes  Historical Provider, MD  Calcium Carbonate Antacid (ANTACID PO) Take 1 tablet by mouth daily as needed (for acid reflux).   Yes Historical Provider, MD  hydroxypropyl methylcellulose / hypromellose (ISOPTO TEARS / GONIOVISC) 2.5 % ophthalmic solution Place 1 drop into both eyes.   Yes Historical Provider, MD  losartan (COZAAR) 100 MG tablet Take 100 mg by mouth daily.     Yes Historical Provider, MD  metFORMIN (GLUCOPHAGE) 500 MG tablet Take 1 tablet by mouth daily. 06/26/14  Yes Historical Provider, MD                 BP 182/83 mmHg  Pulse 80  Temp(Src) 98.8 F (37.1 C) (Oral)  Resp 18  Ht 5\' 3"  (1.6 m)  Wt 186 lb (84.369 kg)  BMI 32.96 kg/m2  SpO2 100% Physical Exam  Constitutional: She is oriented to person, place, and time. She appears well-developed and well-nourished. No distress.  HENT:  Head: Normocephalic and atraumatic.  Eyes: EOM are normal. Pupils are equal, round, and reactive to light.  Neck: Normal range of motion.  Cardiovascular: Normal rate, regular rhythm and normal heart sounds.   Pulmonary/Chest: Effort normal and breath  sounds normal.  Abdominal: Soft. She exhibits no distension. There is no tenderness.  Musculoskeletal: Normal range of motion.  Neurological: She is alert and oriented to person, place, and time.  5/5 strength in major muscle groups of  bilateral upper and lower extremities. Speech normal. No facial asymetry.   Skin: Skin is warm and dry.  Psychiatric: She has a normal mood and affect. Judgment normal.  Nursing note and vitals reviewed.   ED Course  Procedures (including critical care time) Labs Review Labs Reviewed  CBC - Abnormal; Notable for the following:    Hemoglobin 11.5 (*)    All other components within normal limits  BASIC METABOLIC PANEL - Abnormal; Notable for the following:    Glucose, Bld 179 (*)    GFR calc non Af Amer 84 (*)    All other components within normal limits  CBG MONITORING, ED - Abnormal; Notable for the  following:    Glucose-Capillary 131 (*)    All other components within normal limits    Imaging Review No results found.   EKG Interpretation   Date/Time:  Thursday July 05 2014 17:40:32 EDT Ventricular Rate:  89 PR Interval:  149 QRS Duration: 83 QT Interval:  379 QTC Calculation: 461 R Axis:   45 Text Interpretation:  Sinus rhythm Atrial premature complex Borderline T  abnormalities, anterior leads Baseline wander in lead(s) I II III aVL aVF  V1 V4 V5 No significant change was found Confirmed by Crixus Mcaulay  MD, Lennette Bihari  ([id number]) on 07/05/2014 5:43:05 PM      MDM   Final diagnoses:  Vertigo    6:59 PM Patient feels much better after her Valium in the emergency department.  I suspect this is peripheral vertigo.  Doubt central vertigo.  Normal neurologic examination.  Close primary care follow-up.  Home with a short course of Valium when necessary dizziness.  Patient understands return to the emergency department for new or worsening symptoms.    Jola Schmidt, MD 07/05/14 1901

## 2014-07-05 NOTE — ED Notes (Signed)
Pt reports was playing a game on her tablet and at 4pm started feeling dizzy, nauseated, and had 1 episode of diarrhea.  Denies headache or extremity weakness or numbness.

## 2014-07-05 NOTE — ED Notes (Signed)
Pt reports possible vertigo in past 19-20 years ago

## 2014-07-05 NOTE — Discharge Instructions (Signed)

## 2015-02-11 ENCOUNTER — Encounter: Payer: Self-pay | Admitting: Internal Medicine

## 2015-02-27 ENCOUNTER — Ambulatory Visit: Payer: Medicare PPO | Admitting: Nurse Practitioner

## 2015-03-20 ENCOUNTER — Encounter: Payer: Self-pay | Admitting: Nurse Practitioner

## 2015-03-20 ENCOUNTER — Other Ambulatory Visit: Payer: Self-pay

## 2015-03-20 ENCOUNTER — Ambulatory Visit (INDEPENDENT_AMBULATORY_CARE_PROVIDER_SITE_OTHER): Payer: Medicare HMO | Admitting: Nurse Practitioner

## 2015-03-20 VITALS — BP 167/87 | HR 81 | Temp 98.3°F | Ht 63.0 in | Wt 182.4 lb

## 2015-03-20 DIAGNOSIS — Z1211 Encounter for screening for malignant neoplasm of colon: Secondary | ICD-10-CM | POA: Diagnosis not present

## 2015-03-20 DIAGNOSIS — Z86010 Personal history of colonic polyps: Secondary | ICD-10-CM

## 2015-03-20 MED ORDER — PEG 3350-KCL-NA BICARB-NACL 420 G PO SOLR: 4000.0000 mL | Freq: Once | ORAL | Status: DC

## 2015-03-20 NOTE — Progress Notes (Signed)
Primary Care Physician:  Petra Kuba, MD Primary Gastroenterologist:  Dr. Gala Romney  Chief Complaint  Patient presents with  . Colonoscopy    HPI:   70 year old female presents on follow-up from PCP for colonoscopy. PCP notes reviewed. Per PCPs notes last colonoscopy in January 2012 with a pedunculated polyp removed and next due in February 2017. Colonoscopy appears to been completed on 08/26/2010 which found diverticulosis, pedunculated polyp opposite the ileocecal valve status post polypectomy, single external hemorrhoid tag. Surgical pathology found the polyp to be tubular adenoma. Recommend 5 year repeat colonoscopy.  Today she states she's doing well overall. Denies abdominal pain, N/V, change in bowel habits, unintentional weight loss, hematochezia, melena, fever, chills. Denies chest pain, dyspnea, dizziness, lightheadedness, syncope, near syncope. Denies any other upper or lower GI symptoms.  Past Medical History  Diagnosis Date  . Diabetes mellitus without complication ([place])   . Hypertension   . Headache   . Diverticulosis     pt denies  . Chronic back pain   . Lumbar radiculopathy     Past Surgical History  Procedure Laterality Date  . Abdominal hysterectomy    . Throat surgery      Current Outpatient Prescriptions  Medication Sig Dispense Refill  . acetaminophen (TYLENOL) 500 MG tablet Take 500 mg by mouth every 6 (six) hours as needed.    Marland Kitchen aspirin 81 MG tablet Take 81 mg by mouth daily.      . Calcium Carbonate Antacid (ANTACID PO) Take 1 tablet by mouth daily as needed (for acid reflux).    . cetirizine (ZYRTEC) 10 MG tablet Take 10 mg by mouth as needed for allergies.    . hydroxypropyl methylcellulose / hypromellose (ISOPTO TEARS / GONIOVISC) 2.5 % ophthalmic solution Place 1 drop into both eyes.    Marland Kitchen losartan-hydrochlorothiazide (HYZAAR) 100-25 MG tablet Take 1 tablet by mouth daily.    . metFORMIN (GLUCOPHAGE) 500 MG tablet Take 1 tablet by mouth  daily.    . diazepam (VALIUM) 5 MG tablet Take 1 tablet (5 mg total) by mouth every 8 (eight) hours as needed (dizziness). (Patient not taking: Reported on 03/20/2015) 12 tablet 0  . losartan (COZAAR) 100 MG tablet Take 100 mg by mouth daily. Reported on 03/20/2015    . ondansetron (ZOFRAN ODT) 8 MG disintegrating tablet Take 1 tablet (8 mg total) by mouth every 8 (eight) hours as needed for nausea or vomiting. (Patient not taking: Reported on 03/20/2015) 12 tablet 0   No current facility-administered medications for this visit.    Allergies as of 03/20/2015  . (No Known Allergies)    Family History  Problem Relation Age of Onset  . Stroke Mother   . Diabetes Other     Social History   Social History  . Marital Status: Divorced    Spouse Name: N/A  . Number of Children: N/A  . Years of Education: N/A   Occupational History  . Not on file.   Social History Main Topics  . Smoking status: Never Smoker   . Smokeless tobacco: Never Used  . Alcohol Use: No  . Drug Use: No  . Sexual Activity: Not on file   Other Topics Concern  . Not on file   Social History Narrative    Review of Systems: General: Negative for anorexia, weight loss, fever, chills, fatigue, weakness. Eyes: Negative for vision changes.  ENT: Negative for hoarseness, difficulty swallowing. CV: Negative for chest pain, angina, palpitations, peripheral edema.  Respiratory: Negative  for dyspnea at rest, cough, sputum, wheezing.  GI: See history of present illness. Derm: Negative for rash or itching.  Endo: Negative for unusual weight change.  Heme: Negative for bruising or bleeding. Allergy: Negative for rash or hives.    Physical Exam: BP 167/87 mmHg  Pulse 81  Temp(Src) 98.3 F (36.8 C) (Oral)  Ht 5\' 3"  (1.6 m)  Wt 182 lb 6.4 oz (82.736 kg)  BMI 32.32 kg/m2 General:   Alert and oriented. Pleasant and cooperative. Well-nourished and well-developed.  Head:  Normocephalic and atraumatic. Eyes:   Without icterus, sclera clear and conjunctiva pink.  Ears:  Normal auditory acuity. Cardiovascular:  S1, S2 present without murmurs appreciated. Extremities without clubbing or edema. Respiratory:  Clear to auscultation bilaterally. No wheezes, rales, or rhonchi. No distress.  Gastrointestinal:  +BS, soft, non-tender and non-distended. No HSM noted. No guarding or rebound. No masses appreciated.  Rectal:  Deferred  Skin:  Intact without significant lesions or rashes. Neurologic:  Alert and oriented x4;  grossly normal neurologically. Psych:  Alert and cooperative. Normal mood and affect. Heme/Lymph/Immune: No excessive bruising noted.    03/20/2015 9:57 AM

## 2015-03-20 NOTE — Progress Notes (Signed)
cc'ed to pcp °

## 2015-03-20 NOTE — Assessment & Plan Note (Signed)
Patient with a history of a pedunculated polyp opposite the ileocecal valve on her last colonoscopy which was determined to be tubular adenoma on surgical pathology. No family history of colon cancer. She presents today for a 5 year repeat screening colonoscopy due to previous polyp found. Return for follow-up as needed

## 2015-03-20 NOTE — Patient Instructions (Signed)
1. We will schedule your procedure for you. 2. Further recommendations to be based on results of your procedure. 3. Return for follow-up as needed based on procedure recommendations or as needed for any stomach or colon symptoms.

## 2015-03-20 NOTE — Assessment & Plan Note (Signed)
Patient here for early interval screening colonoscopy at 5 years post-last colonoscopy due to tubular adenoma polyp found as noted above. She is generally asymptomatic from a GI standpoint. No red flag/warning signs or symptoms. At this point we'll move forward with her colonoscopy as recommended previously.  Proceed with TCS with Dr. Gala Romney in near future: the risks, benefits, and alternatives have been discussed with the patient in detail. The patient states understanding and desires to proceed.  The patient is not on any anticoagulants, anxiolytics, chronic pain medications, or antidepressants. Last procedure completed on conscious sedation in this should be adequate for this procedure as well.

## 2015-03-28 ENCOUNTER — Encounter (HOSPITAL_COMMUNITY)
Admission: RE | Disposition: A | Discharge status: Home / Self Care | Payer: Self-pay | Source: Ambulatory Visit | Attending: Internal Medicine

## 2015-03-28 ENCOUNTER — Ambulatory Visit (HOSPITAL_COMMUNITY)
Admission: RE | Admit: 2015-03-28 | Discharge: 2015-03-28 | Disposition: A | Discharge status: Home / Self Care | Payer: Medicare HMO | Source: Ambulatory Visit | Attending: Internal Medicine | Admitting: Internal Medicine

## 2015-03-28 ENCOUNTER — Encounter (HOSPITAL_COMMUNITY): Payer: Self-pay

## 2015-03-28 DIAGNOSIS — I10 Essential (primary) hypertension: Secondary | ICD-10-CM | POA: Insufficient documentation

## 2015-03-28 DIAGNOSIS — Z7984 Long term (current) use of oral hypoglycemic drugs: Secondary | ICD-10-CM | POA: Diagnosis not present

## 2015-03-28 DIAGNOSIS — Z79899 Other long term (current) drug therapy: Secondary | ICD-10-CM | POA: Insufficient documentation

## 2015-03-28 DIAGNOSIS — Z86010 Personal history of colonic polyps: Secondary | ICD-10-CM | POA: Insufficient documentation

## 2015-03-28 DIAGNOSIS — E119 Type 2 diabetes mellitus without complications: Secondary | ICD-10-CM | POA: Diagnosis not present

## 2015-03-28 DIAGNOSIS — Z1211 Encounter for screening for malignant neoplasm of colon: Secondary | ICD-10-CM | POA: Diagnosis not present

## 2015-03-28 DIAGNOSIS — Z7982 Long term (current) use of aspirin: Secondary | ICD-10-CM | POA: Insufficient documentation

## 2015-03-28 HISTORY — PX: COLONOSCOPY: SHX5424

## 2015-03-28 LAB — GLUCOSE, CAPILLARY: Glucose-Capillary: 88 mg/dL (ref 65–99)

## 2015-03-28 SURGERY — COLONOSCOPY
Anesthesia: Moderate Sedation

## 2015-03-28 MED ORDER — STERILE WATER FOR IRRIGATION IR SOLN
Status: DC | PRN
  Administered 2015-03-28: 2.5 mL

## 2015-03-28 MED ORDER — MIDAZOLAM HCL 5 MG/5ML IJ SOLN
INTRAMUSCULAR | Status: DC | PRN
  Administered 2015-03-28: 1 mg via INTRAVENOUS
  Administered 2015-03-28 (×2): 2 mg via INTRAVENOUS

## 2015-03-28 MED ORDER — MEPERIDINE HCL 100 MG/ML IJ SOLN
INTRAMUSCULAR | Status: AC
  Filled 2015-03-28: qty 2

## 2015-03-28 MED ORDER — ONDANSETRON HCL 4 MG/2ML IJ SOLN
INTRAMUSCULAR | Status: DC | PRN
  Administered 2015-03-28: 4 mg via INTRAVENOUS

## 2015-03-28 MED ORDER — SODIUM CHLORIDE 0.9 % IV SOLN
INTRAVENOUS | Status: DC
  Administered 2015-03-28: 13:00:00 via INTRAVENOUS

## 2015-03-28 MED ORDER — ONDANSETRON HCL 4 MG/2ML IJ SOLN
INTRAMUSCULAR | Status: AC
  Filled 2015-03-28: qty 2

## 2015-03-28 MED ORDER — MIDAZOLAM HCL 5 MG/5ML IJ SOLN
INTRAMUSCULAR | Status: AC
  Filled 2015-03-28: qty 10

## 2015-03-28 MED ORDER — MEPERIDINE HCL 100 MG/ML IJ SOLN
INTRAMUSCULAR | Status: DC | PRN
  Administered 2015-03-28 (×2): 25 mg via INTRAVENOUS

## 2015-03-28 NOTE — H&P (View-Only) (Signed)
Primary Care Physician:  Petra Kuba, MD Primary Gastroenterologist:  Dr. Gala Romney  Chief Complaint  Patient presents with  . Colonoscopy    HPI:   70 year old female presents on follow-up from PCP for colonoscopy. PCP notes reviewed. Per PCPs notes last colonoscopy in January 2012 with a pedunculated polyp removed and next due in February 2017. Colonoscopy appears to been completed on 08/26/2010 which found diverticulosis, pedunculated polyp opposite the ileocecal valve status post polypectomy, single external hemorrhoid tag. Surgical pathology found the polyp to be tubular adenoma. Recommend 5 year repeat colonoscopy.  Today she states she's doing well overall. Denies abdominal pain, N/V, change in bowel habits, unintentional weight loss, hematochezia, melena, fever, chills. Denies chest pain, dyspnea, dizziness, lightheadedness, syncope, near syncope. Denies any other upper or lower GI symptoms.  Past Medical History  Diagnosis Date  . Diabetes mellitus without complication ([place])   . Hypertension   . Headache   . Diverticulosis     pt denies  . Chronic back pain   . Lumbar radiculopathy     Past Surgical History  Procedure Laterality Date  . Abdominal hysterectomy    . Throat surgery      Current Outpatient Prescriptions  Medication Sig Dispense Refill  . acetaminophen (TYLENOL) 500 MG tablet Take 500 mg by mouth every 6 (six) hours as needed.    Marland Kitchen aspirin 81 MG tablet Take 81 mg by mouth daily.      . Calcium Carbonate Antacid (ANTACID PO) Take 1 tablet by mouth daily as needed (for acid reflux).    . cetirizine (ZYRTEC) 10 MG tablet Take 10 mg by mouth as needed for allergies.    . hydroxypropyl methylcellulose / hypromellose (ISOPTO TEARS / GONIOVISC) 2.5 % ophthalmic solution Place 1 drop into both eyes.    Marland Kitchen losartan-hydrochlorothiazide (HYZAAR) 100-25 MG tablet Take 1 tablet by mouth daily.    . metFORMIN (GLUCOPHAGE) 500 MG tablet Take 1 tablet by mouth  daily.    . diazepam (VALIUM) 5 MG tablet Take 1 tablet (5 mg total) by mouth every 8 (eight) hours as needed (dizziness). (Patient not taking: Reported on 03/20/2015) 12 tablet 0  . losartan (COZAAR) 100 MG tablet Take 100 mg by mouth daily. Reported on 03/20/2015    . ondansetron (ZOFRAN ODT) 8 MG disintegrating tablet Take 1 tablet (8 mg total) by mouth every 8 (eight) hours as needed for nausea or vomiting. (Patient not taking: Reported on 03/20/2015) 12 tablet 0   No current facility-administered medications for this visit.    Allergies as of 03/20/2015  . (No Known Allergies)    Family History  Problem Relation Age of Onset  . Stroke Mother   . Diabetes Other     Social History   Social History  . Marital Status: Divorced    Spouse Name: N/A  . Number of Children: N/A  . Years of Education: N/A   Occupational History  . Not on file.   Social History Main Topics  . Smoking status: Never Smoker   . Smokeless tobacco: Never Used  . Alcohol Use: No  . Drug Use: No  . Sexual Activity: Not on file   Other Topics Concern  . Not on file   Social History Narrative    Review of Systems: General: Negative for anorexia, weight loss, fever, chills, fatigue, weakness. Eyes: Negative for vision changes.  ENT: Negative for hoarseness, difficulty swallowing. CV: Negative for chest pain, angina, palpitations, peripheral edema.  Respiratory: Negative  for dyspnea at rest, cough, sputum, wheezing.  GI: See history of present illness. Derm: Negative for rash or itching.  Endo: Negative for unusual weight change.  Heme: Negative for bruising or bleeding. Allergy: Negative for rash or hives.    Physical Exam: BP 167/87 mmHg  Pulse 81  Temp(Src) 98.3 F (36.8 C) (Oral)  Ht 5\' 3"  (1.6 m)  Wt 182 lb 6.4 oz (82.736 kg)  BMI 32.32 kg/m2 General:   Alert and oriented. Pleasant and cooperative. Well-nourished and well-developed.  Head:  Normocephalic and atraumatic. Eyes:   Without icterus, sclera clear and conjunctiva pink.  Ears:  Normal auditory acuity. Cardiovascular:  S1, S2 present without murmurs appreciated. Extremities without clubbing or edema. Respiratory:  Clear to auscultation bilaterally. No wheezes, rales, or rhonchi. No distress.  Gastrointestinal:  +BS, soft, non-tender and non-distended. No HSM noted. No guarding or rebound. No masses appreciated.  Rectal:  Deferred  Skin:  Intact without significant lesions or rashes. Neurologic:  Alert and oriented x4;  grossly normal neurologically. Psych:  Alert and cooperative. Normal mood and affect. Heme/Lymph/Immune: No excessive bruising noted.    03/20/2015 9:57 AM

## 2015-03-28 NOTE — Discharge Instructions (Signed)
°  Colonoscopy Discharge Instructions  Read the instructions outlined below and refer to this sheet in the next few weeks. These discharge instructions provide you with general information on caring for yourself after you leave the hospital. Your doctor may also give you specific instructions. While your treatment has been planned according to the most current medical practices available, unavoidable complications occasionally occur. If you have any problems or questions after discharge, call Dr. Gala Romney at [phone]. ACTIVITY  You may resume your regular activity, but move at a slower pace for the next 24 hours.   Take frequent rest periods for the next 24 hours.   Walking will help get rid of the air and reduce the bloated feeling in your belly (abdomen).   No driving for 24 hours (because of the medicine (anesthesia) used during the test).    Do not sign any important legal documents or operate any machinery for 24 hours (because of the anesthesia used during the test).  NUTRITION  Drink plenty of fluids.   You may resume your normal diet as instructed by your doctor.   Begin with a light meal and progress to your normal diet. Heavy or fried foods are harder to digest and may make you feel sick to your stomach (nauseated).   Avoid alcoholic beverages for 24 hours or as instructed.  MEDICATIONS  You may resume your normal medications unless your doctor tells you otherwise.  WHAT YOU CAN EXPECT TODAY  Some feelings of bloating in the abdomen.   Passage of more gas than usual.   Spotting of blood in your stool or on the toilet paper.  IF YOU HAD POLYPS REMOVED DURING THE COLONOSCOPY:  No aspirin products for 7 days or as instructed.   No alcohol for 7 days or as instructed.   Eat a soft diet for the next 24 hours.  FINDING OUT THE RESULTS OF YOUR TEST Not all test results are available during your visit. If your test results are not back during the visit, make an appointment  with your caregiver to find out the results. Do not assume everything is normal if you have not heard from your caregiver or the medical facility. It is important for you to follow up on all of your test results.  SEEK IMMEDIATE MEDICAL ATTENTION IF:  You have more than a spotting of blood in your stool.   Your belly is swollen (abdominal distention).   You are nauseated or vomiting.   You have a temperature over 101.   You have abdominal pain or discomfort that is severe or gets worse throughout the day.   Consider having one more colonoscopy in 7 years of overall health permits.

## 2015-03-28 NOTE — Interval H&P Note (Signed)
History and Physical Interval Note:  03/28/2015 1:12 PM  Angela Grimes  has presented today for surgery, with the diagnosis of history of colon adenoma, screening colonoscopy  The various methods of treatment have been discussed with the patient and family. After consideration of risks, benefits and other options for treatment, the patient has consented to  Procedure(s) with comments: COLONOSCOPY (N/A) - 1200pm - moved to 1:30 - office to notify as a surgical intervention .  The patient's history has been reviewed, patient examined, no change in status, stable for surgery.  I have reviewed the patient's chart and labs.  Questions were answered to the patient's satisfaction.     Charliee Krenz  No change; surveillance TCS per plan.  The risks, benefits, limitations, alternatives and imponderables have been reviewed with the patient. Questions have been answered. All parties are agreeable.

## 2015-03-29 NOTE — Op Note (Signed)
[facility] [address] [place], [address]   COLONOSCOPY PROCEDURE REPORT  PATIENT: Angela Grimes, Angela Grimes  MR#: [id number] BIRTHDATE: 05/03/1947 , 67  yrs. old GENDER: female ENDOSCOPIST: R.  Garfield Cornea, MD FACP [facility] REFERRED [id number] Shade Flood, M.D. PROCEDURE DATE:  17-Apr-2015 PROCEDURE:   Ileo-colonoscopy, surveillance INDICATIONS:History of colonic adenoma. MEDICATIONS: Versed 5 mg IV and Demerol 50 mg IV in divided doses. Zofran 4 mg IV. ASA CLASS:       Class II  CONSENT: The risks, benefits, alternatives and imponderables including but not limited to bleeding, perforation as well as the possibility of a missed lesion have been reviewed.  The potential for biopsy, lesion removal, etc. have also been discussed. Questions have been answered.  All parties agreeable.  Please see the history and physical in the medical record for more information.  DESCRIPTION OF PROCEDURE:   After the risks benefits and alternatives of the procedure were thoroughly explained, informed consent was obtained.  The digital rectal exam revealed no abnormalities of the rectum.   The [id number]  endoscope was introduced through the anus and advanced to the terminal ileum which was intubated for a short distance. No adverse events experienced.   The quality of the prep was adequate  The instrument was then slowly withdrawn as the colon was fully examined. Estimated blood loss is zero unless otherwise noted in this procedure report.      COLON FINDINGS: Normal-appearing rectal mucosa.  Normal-appearing colonic mucosa.  The distal 5 cm of terminal ileal mucosa also appeared normal.  Retroflexed views revealed no abnormalities. .  Withdrawal time=9 minutes 0 seconds.  The scope was withdrawn and the procedure completed. COMPLICATIONS: There were no immediate complications.  ENDOSCOPIC IMPRESSION: Normal ileo- colonoscopy  RECOMMENDATIONS: Consider 1 more surveillance  colonoscopy in 7 years if overall health permits.  eSigned:  R. Garfield Cornea, MD Rosalita Chessman [facility] 2015-04-17 1:45 PM   cc:  CPT CODES: ICD CODES:  The ICD and CPT codes recommended by this software are interpretations from the data that the clinical staff has captured with the software.  The verification of the translation of this report to the ICD and CPT codes and modifiers is the sole responsibility of the health care institution and practicing physician where this report was generated.  [employer] [place]. will not be held responsible for the validity of the ICD and CPT codes included on this report.  AMA assumes no liability for data contained or not contained herein. CPT is a Designer, television/film set of the [employer].  PATIENT NAME:  [name], [name] MR#: [id number]

## 2015-04-02 ENCOUNTER — Encounter (HOSPITAL_COMMUNITY): Payer: Self-pay | Admitting: Internal Medicine

## 2015-06-13 ENCOUNTER — Other Ambulatory Visit: Payer: Self-pay | Admitting: Family Medicine

## 2015-06-14 ENCOUNTER — Other Ambulatory Visit: Payer: Self-pay | Admitting: Family Medicine

## 2015-06-14 DIAGNOSIS — Z13820 Encounter for screening for osteoporosis: Secondary | ICD-10-CM

## 2015-10-12 ENCOUNTER — Encounter (HOSPITAL_COMMUNITY): Payer: Self-pay | Admitting: Emergency Medicine

## 2015-10-12 ENCOUNTER — Emergency Department (HOSPITAL_COMMUNITY)
Admission: EM | Admit: 2015-10-12 | Discharge: 2015-10-12 | Disposition: A | Discharge status: Home / Self Care | Payer: Medicare HMO | Attending: Emergency Medicine | Admitting: Emergency Medicine

## 2015-10-12 ENCOUNTER — Emergency Department (HOSPITAL_COMMUNITY): Payer: Medicare HMO

## 2015-10-12 DIAGNOSIS — E119 Type 2 diabetes mellitus without complications: Secondary | ICD-10-CM | POA: Insufficient documentation

## 2015-10-12 DIAGNOSIS — Z7982 Long term (current) use of aspirin: Secondary | ICD-10-CM | POA: Diagnosis not present

## 2015-10-12 DIAGNOSIS — E876 Hypokalemia: Secondary | ICD-10-CM | POA: Diagnosis not present

## 2015-10-12 DIAGNOSIS — R002 Palpitations: Secondary | ICD-10-CM | POA: Insufficient documentation

## 2015-10-12 DIAGNOSIS — Z79899 Other long term (current) drug therapy: Secondary | ICD-10-CM | POA: Diagnosis not present

## 2015-10-12 DIAGNOSIS — I10 Essential (primary) hypertension: Secondary | ICD-10-CM | POA: Diagnosis not present

## 2015-10-12 LAB — CBC
HEMATOCRIT: 36.8 % (ref 36.0–46.0)
HEMOGLOBIN: 12 g/dL (ref 12.0–15.0)
MCH: 27.8 pg (ref 26.0–34.0)
MCHC: 32.6 g/dL (ref 30.0–36.0)
MCV: 85.4 fL (ref 78.0–100.0)
Platelets: 384 10*3/uL (ref 150–400)
RBC: 4.31 MIL/uL (ref 3.87–5.11)
RDW: 13.3 % (ref 11.5–15.5)
WBC: 8.2 10*3/uL (ref 4.0–10.5)

## 2015-10-12 LAB — BASIC METABOLIC PANEL
ANION GAP: 10 (ref 5–15)
BUN: 20 mg/dL (ref 6–20)
CHLORIDE: 100 mmol/L — AB (ref 101–111)
CO2: 24 mmol/L (ref 22–32)
Calcium: 9.2 mg/dL (ref 8.9–10.3)
Creatinine, Ser: 0.71 mg/dL (ref 0.44–1.00)
GFR calc Af Amer: >60 mL/min (ref >60)
GLUCOSE: 176 mg/dL — AB (ref 65–99)
POTASSIUM: 2.8 mmol/L — AB (ref 3.5–5.1)
Sodium: 134 mmol/L — ABNORMAL LOW (ref 135–145)

## 2015-10-12 LAB — HEPATIC FUNCTION PANEL
ALK PHOS: 97 U/L (ref 38–126)
ALT: 13 U/L — AB (ref 14–54)
AST: 16 U/L (ref 15–41)
Albumin: 4 g/dL (ref 3.5–5.0)
Bilirubin, Direct: <0.1 mg/dL — ABNORMAL LOW (ref 0.1–0.5)
TOTAL PROTEIN: 8.1 g/dL (ref 6.5–8.1)
Total Bilirubin: 0.4 mg/dL (ref 0.3–1.2)

## 2015-10-12 LAB — TROPONIN I: Troponin I: <0.03 ng/mL (ref <0.03)

## 2015-10-12 MED ORDER — POTASSIUM CHLORIDE CRYS ER 20 MEQ PO TBCR: 20.0000 meq | EXTENDED_RELEASE_TABLET | Freq: Every day | ORAL | 0 refills | Status: DC

## 2015-10-12 MED ORDER — POTASSIUM CHLORIDE CRYS ER 20 MEQ PO TBCR
40.0000 meq | EXTENDED_RELEASE_TABLET | Freq: Once | ORAL | Status: AC
  Administered 2015-10-12: 40 meq via ORAL
  Filled 2015-10-12: qty 2

## 2015-10-12 MED ORDER — LORAZEPAM 2 MG/ML IJ SOLN
1.0000 mg | Freq: Once | INTRAMUSCULAR | Status: AC
  Administered 2015-10-12: 1 mg via INTRAVENOUS
  Filled 2015-10-12: qty 1

## 2015-10-12 MED ORDER — METOPROLOL SUCCINATE ER 25 MG PO TB24: ORAL_TABLET | ORAL | 0 refills | Status: AC

## 2015-10-12 MED ORDER — METOPROLOL TARTRATE 25 MG PO TABS
25.0000 mg | ORAL_TABLET | Freq: Once | ORAL | Status: DC
  Filled 2015-10-12: qty 1

## 2015-10-12 NOTE — ED Provider Notes (Signed)
[facility] DEPT Provider Note   CSN: [id number] Arrival date & time: 10/12/15  0700  First Provider Contact:  None       History   Chief Complaint Chief Complaint  Patient presents with  . Palpitations    HPI Angela Grimes is a 70 y.o. female.  Patient complains of palpitations today no chest pain   The history is provided by the patient. No language interpreter was used.  Palpitations   This is a new problem. The current episode started 12 to 24 hours ago. The problem occurs constantly. The problem has not changed since onset.The problem is associated with anxiety. Pertinent negatives include no diaphoresis, no chest pain, no abdominal pain, no headaches, no back pain and no cough.    Past Medical History:  Diagnosis Date  . Chronic back pain   . Diabetes mellitus without complication ([place])   . Diverticulosis    pt denies  . Headache   . Hypertension   . Lumbar radiculopathy     Patient Active Problem List   Diagnosis Date Noted  . History of adenomatous polyp of colon 03/20/2015  . Encounter for screening colonoscopy 03/20/2015    Past Surgical History:  Procedure Laterality Date  . ABDOMINAL HYSTERECTOMY    . COLONOSCOPY     2012: left-side diverticula, single pedunculated polyp tubular adenoma  . COLONOSCOPY N/A 03/28/2015   Procedure: COLONOSCOPY;  Surgeon: Daneil Dolin, MD;  Location: [facility];  Service: Endoscopy;  Laterality: N/A;  1200pm - moved to 1:30 - office to notify  . THROAT SURGERY      OB History    Gravida Para Term Preterm AB Living   9 8 8   1 7    SAB TAB Ectopic Multiple Live Births   1               Home Medications    Prior to Admission medications   Medication Sig Start Date End Date Taking? Authorizing Provider  acetaminophen (TYLENOL) 500 MG tablet Take 500 mg by mouth every 6 (six) hours as needed for mild pain.     Historical Provider, MD  aspirin 81 MG tablet Take 81 mg by mouth daily.      Historical  Provider, MD  Calcium Carbonate Antacid (ANTACID PO) Take 1 tablet by mouth daily as needed (for acid reflux).    Historical Provider, MD  cetirizine (ZYRTEC) 10 MG tablet Take 10 mg by mouth as needed for allergies.    Historical Provider, MD  hydroxypropyl methylcellulose / hypromellose (ISOPTO TEARS / GONIOVISC) 2.5 % ophthalmic solution Place 1 drop into both eyes daily as needed for dry eyes.     Historical Provider, MD  losartan-hydrochlorothiazide (HYZAAR) 100-25 MG tablet Take 1 tablet by mouth daily.    Historical Provider, MD  metFORMIN (GLUCOPHAGE) 500 MG tablet Take 1 tablet by mouth daily. 06/26/14   Historical Provider, MD  metoprolol succinate (TOPROL-XL) 25 MG 24 hr tablet Take one pill every 12 hours if you have palpitaions 10/12/15   Milton Ferguson, MD  polyethylene glycol-electrolytes (NULYTELY/GOLYTELY) 420 g solution Take 4,000 mLs by mouth once. 03/20/15   Carlis Stable, NP  potassium chloride SA (K-DUR,KLOR-CON) 20 MEQ tablet Take 1 tablet (20 mEq total) by mouth daily. 10/12/15   Milton Ferguson, MD    Family History Family History  Problem Relation Age of Onset  . Stroke Mother   . Diabetes Other   . Colon cancer Neg Hx  Social History Social History  Substance Use Topics  . Smoking status: Never Smoker  . Smokeless tobacco: Never Used  . Alcohol use No     Allergies   Review of patient's allergies indicates no known allergies.   Review of Systems Review of Systems  Constitutional: Negative for appetite change, diaphoresis and fatigue.  HENT: Negative for congestion, ear discharge and sinus pressure.   Eyes: Negative for discharge.  Respiratory: Negative for cough.   Cardiovascular: Positive for palpitations. Negative for chest pain.  Gastrointestinal: Negative for abdominal pain and diarrhea.  Genitourinary: Negative for frequency and hematuria.  Musculoskeletal: Negative for back pain.  Skin: Negative for rash.  Neurological: Negative for seizures and  headaches.  Psychiatric/Behavioral: Negative for hallucinations.     Physical Exam Updated Vital Signs BP 165/70   Pulse 76   Temp 97.6 F (36.4 C) (Oral)   Resp 16   Ht 5\' 3"  (1.6 m)   Wt 177 lb (80.3 kg)   SpO2 97%   BMI 31.35 kg/m   Physical Exam  Constitutional: She is oriented to person, place, and time. She appears well-developed.  HENT:  Head: Normocephalic.  Eyes: Conjunctivae and EOM are normal. No scleral icterus.  Neck: Neck supple. No thyromegaly present.  Cardiovascular: Normal rate.  Exam reveals no gallop and no friction rub.   No murmur heard. Irregular rate patient having new unifocal  Pulmonary/Chest: No stridor. She has no wheezes. She has no rales. She exhibits no tenderness.  Abdominal: She exhibits no distension. There is no tenderness. There is no rebound.  Musculoskeletal: Normal range of motion. She exhibits no edema.  Lymphadenopathy:    She has no cervical adenopathy.  Neurological: She is oriented to person, place, and time. She exhibits normal muscle tone. Coordination normal.  Skin: No rash noted. No erythema.  Psychiatric: She has a normal mood and affect. Her behavior is normal.     ED Treatments / Results  Labs (all labs ordered are listed, but only abnormal results are displayed) Labs Reviewed  BASIC METABOLIC PANEL - Abnormal; Notable for the following:       Result Value   Sodium 134 (*)    Potassium 2.8 (*)    Chloride 100 (*)    Glucose, Bld 176 (*)    All other components within normal limits  HEPATIC FUNCTION PANEL - Abnormal; Notable for the following:    ALT 13 (*)    Bilirubin, Direct <0.1 (*)    All other components within normal limits  CBC  TROPONIN I    EKG  EKG Interpretation None       Radiology Dg Chest 2 View  Result Date: 10/12/2015 CLINICAL DATA:  Palpitations since 2 a.m. this morning. EXAM: CHEST  2 VIEW COMPARISON:  05/09/2014 FINDINGS: The heart size appears normal. Aortic atherosclerosis noted.  There is no pleural effusion or edema. No airspace consolidation identified. IMPRESSION: 1. No acute cardiopulmonary abnormalities. 2. Aortic atherosclerosis. Electronically Signed   By: Kerby Moors M.D.   On: 10/12/2015 07:47    Procedures Procedures (including critical care time)  Medications Ordered in ED Medications  potassium chloride SA (K-DUR,KLOR-CON) CR tablet 40 mEq (not administered)  metoprolol tartrate (LOPRESSOR) tablet 25 mg (not administered)  LORazepam (ATIVAN) injection 1 mg (1 mg Intravenous Given 10/12/15 0747)     Initial Impression / Assessment and Plan / ED Course  I have reviewed the triage vital signs and the nursing notes.  Pertinent labs & imaging results  that were available during my care of the patient were reviewed by me and considered in my medical decision making (see chart for details).  Clinical Course    Patient with hypokalemia and unifocal PVCs. Patient will be given some potassium and put on metoprolol as needed for the palpitations. Patient is referred to her family doctor for potassium and cardiology for the PVCs  Final Clinical Impressions(s) / ED Diagnoses   Final diagnoses:  Hypokalemia  Palpitations    New Prescriptions New Prescriptions   METOPROLOL SUCCINATE (TOPROL-XL) 25 MG 24 HR TABLET    Take one pill every 12 hours if you have palpitaions   POTASSIUM CHLORIDE SA (K-DUR,KLOR-CON) 20 MEQ TABLET    Take 1 tablet (20 mEq total) by mouth daily.     Milton Ferguson, MD 10/12/15 [phone]

## 2015-10-12 NOTE — ED Notes (Signed)
Called to room by family. Patient c/o anxiety. Appears anxious, tearful, shaking. ST at 114 noted on monitor with unifocal PVCs noted. Patient responded well to coached breathing techniques and dimming lights in room. HR noted to decreased back to 80s NSR with continued PVCs. MD notified and orders received for ativan. See MAR for administration details. Patient provided warm blanket. Family at bedside.

## 2015-10-12 NOTE — Discharge Instructions (Signed)
Follow-up with your family doctor next week to check your potassium. Follow-up with a heart doctor for your palpitations.

## 2015-10-12 NOTE — ED Triage Notes (Signed)
Pt reports palpitations since 2am this morning, denies cp.  Pt also reports numbness/tingling down left arm.  Pt denies n/v/sob/fevers.

## 2015-10-12 NOTE — ED Notes (Signed)
MD at bedside. 

## 2016-05-07 ENCOUNTER — Other Ambulatory Visit (HOSPITAL_COMMUNITY): Payer: Self-pay | Admitting: Family Medicine

## 2016-05-07 ENCOUNTER — Other Ambulatory Visit: Payer: Self-pay | Admitting: Family Medicine

## 2016-05-07 DIAGNOSIS — Z13820 Encounter for screening for osteoporosis: Secondary | ICD-10-CM

## 2016-05-07 DIAGNOSIS — Z780 Asymptomatic menopausal state: Secondary | ICD-10-CM

## 2016-05-13 ENCOUNTER — Other Ambulatory Visit (HOSPITAL_COMMUNITY): Payer: Self-pay | Admitting: Family Medicine

## 2016-05-13 ENCOUNTER — Ambulatory Visit (HOSPITAL_COMMUNITY)
Admission: RE | Admit: 2016-05-13 | Discharge: 2016-05-13 | Disposition: A | Discharge status: Home / Self Care | Payer: Medicare HMO | Source: Ambulatory Visit | Attending: Family Medicine | Admitting: Family Medicine

## 2016-05-13 DIAGNOSIS — Z1231 Encounter for screening mammogram for malignant neoplasm of breast: Secondary | ICD-10-CM

## 2016-05-13 DIAGNOSIS — Z13820 Encounter for screening for osteoporosis: Secondary | ICD-10-CM | POA: Diagnosis present

## 2016-05-13 DIAGNOSIS — Z780 Asymptomatic menopausal state: Secondary | ICD-10-CM | POA: Diagnosis not present

## 2016-07-07 DIAGNOSIS — I10 Essential (primary) hypertension: Secondary | ICD-10-CM | POA: Insufficient documentation

## 2016-07-07 DIAGNOSIS — E1159 Type 2 diabetes mellitus with other circulatory complications: Secondary | ICD-10-CM | POA: Insufficient documentation

## 2016-07-07 DIAGNOSIS — E785 Hyperlipidemia, unspecified: Secondary | ICD-10-CM | POA: Insufficient documentation

## 2016-07-07 DIAGNOSIS — R079 Chest pain, unspecified: Secondary | ICD-10-CM | POA: Insufficient documentation

## 2016-07-07 DIAGNOSIS — F419 Anxiety disorder, unspecified: Secondary | ICD-10-CM | POA: Insufficient documentation

## 2016-07-07 DIAGNOSIS — R002 Palpitations: Secondary | ICD-10-CM | POA: Insufficient documentation

## 2016-07-07 DIAGNOSIS — E119 Type 2 diabetes mellitus without complications: Secondary | ICD-10-CM | POA: Insufficient documentation

## 2019-01-02 ENCOUNTER — Other Ambulatory Visit (HOSPITAL_COMMUNITY): Payer: Self-pay | Admitting: Obstetrics and Gynecology

## 2019-01-02 DIAGNOSIS — Z1231 Encounter for screening mammogram for malignant neoplasm of breast: Secondary | ICD-10-CM

## 2019-01-06 ENCOUNTER — Other Ambulatory Visit (HOSPITAL_COMMUNITY): Payer: Self-pay | Admitting: Obstetrics and Gynecology

## 2019-01-06 DIAGNOSIS — Z1231 Encounter for screening mammogram for malignant neoplasm of breast: Secondary | ICD-10-CM

## 2019-01-25 ENCOUNTER — Ambulatory Visit (HOSPITAL_COMMUNITY)
Admission: RE | Admit: 2019-01-25 | Discharge: 2019-01-25 | Disposition: A | Discharge status: Home / Self Care | Payer: Medicare HMO | Source: Ambulatory Visit | Attending: Family Medicine | Admitting: Family Medicine

## 2019-01-25 ENCOUNTER — Other Ambulatory Visit: Payer: Self-pay

## 2019-01-25 DIAGNOSIS — Z1231 Encounter for screening mammogram for malignant neoplasm of breast: Secondary | ICD-10-CM | POA: Insufficient documentation

## 2019-05-22 ENCOUNTER — Other Ambulatory Visit (HOSPITAL_COMMUNITY): Payer: Self-pay | Admitting: Obstetrics and Gynecology

## 2019-05-22 ENCOUNTER — Other Ambulatory Visit: Payer: Self-pay

## 2019-05-22 ENCOUNTER — Ambulatory Visit (HOSPITAL_COMMUNITY)
Admission: RE | Admit: 2019-05-22 | Discharge: 2019-05-22 | Disposition: A | Discharge status: Home / Self Care | Payer: Medicare HMO | Source: Ambulatory Visit | Attending: Family Medicine | Admitting: Family Medicine

## 2019-05-22 DIAGNOSIS — M25551 Pain in right hip: Secondary | ICD-10-CM | POA: Diagnosis present

## 2019-06-23 ENCOUNTER — Other Ambulatory Visit: Payer: Self-pay

## 2019-06-23 ENCOUNTER — Encounter: Payer: Self-pay | Admitting: Orthopedic Surgery

## 2019-06-23 ENCOUNTER — Ambulatory Visit: Payer: Medicare HMO

## 2019-06-23 ENCOUNTER — Ambulatory Visit: Payer: Medicare HMO | Admitting: Orthopedic Surgery

## 2019-06-23 VITALS — BP 154/85 | HR 68 | Ht 63.0 in | Wt 160.0 lb

## 2019-06-23 DIAGNOSIS — M5136 Other intervertebral disc degeneration, lumbar region: Secondary | ICD-10-CM | POA: Diagnosis not present

## 2019-06-23 DIAGNOSIS — M1611 Unilateral primary osteoarthritis, right hip: Secondary | ICD-10-CM

## 2019-06-23 DIAGNOSIS — M7918 Myalgia, other site: Secondary | ICD-10-CM

## 2019-06-23 MED ORDER — GABAPENTIN 100 MG PO CAPS: 100.0000 mg | ORAL_CAPSULE | Freq: Three times a day (TID) | ORAL | 2 refills | Status: DC

## 2019-06-23 NOTE — Patient Instructions (Addendum)
You have mild arthritis in your hip that is probably bothering you when you get in and out of the car.  Do not need surgery at this time  He also have irritation of the nerve in your right buttock area which is coming from scoliosis and arthritis in the spine this should respond to physical therapy.  When you get extreme pain take 100 mg of gabapentin which was prescribed as a new prescription     Osteoarthritis  Osteoarthritis is a type of arthritis that affects tissue that covers the ends of bones in joints (cartilage). Cartilage acts as a cushion between the bones and helps them move smoothly. Osteoarthritis results when cartilage in the joints gets worn down. Osteoarthritis is sometimes called "wear and tear" arthritis. Osteoarthritis is the most common form of arthritis. It often occurs in older people. It is a condition that gets worse over time (a progressive condition). Joints that are most often affected by this condition are in:  Fingers.  Toes.  Hips.  Knees.  Spine, including neck and lower back. What are the causes? This condition is caused by age-related wearing down of cartilage that covers the ends of bones. What increases the risk? The following factors may make you more likely to develop this condition:  Older age.  Being overweight or obese.  Overuse of joints, such as in athletes.  Past injury of a joint.  Past surgery on a joint.  Family history of osteoarthritis. What are the signs or symptoms? The main symptoms of this condition are pain, swelling, and stiffness in the joint. The joint may lose its shape over time. Small pieces of bone or cartilage may break off and float inside of the joint, which may cause more pain and damage to the joint. Small deposits of bone (osteophytes) may grow on the edges of the joint. Other symptoms may include:  A grating or scraping feeling inside the joint when you move it.  Popping or creaking sounds when you  move. Symptoms may affect one or more joints. Osteoarthritis in a major joint, such as your knee or hip, can make it painful to walk or exercise. If you have osteoarthritis in your hands, you might not be able to grip items, twist your hand, or control small movements of your hands and fingers (fine motor skills). How is this diagnosed? This condition may be diagnosed based on:  Your medical history.  A physical exam.  Your symptoms.  X-rays of the affected joint(s).  Blood tests to rule out other types of arthritis. How is this treated? There is no cure for this condition, but treatment can help to control pain and improve joint function. Treatment plans may include:  A prescribed exercise program that allows for rest and joint relief. You may work with a physical therapist.  A weight control plan.  Pain relief techniques, such as: ? Applying heat and cold to the joint. ? Electric pulses delivered to nerve endings under the skin (transcutaneous electrical nerve stimulation, or TENS). ? Massage. ? Certain nutritional supplements.  NSAIDs or prescription medicines to help relieve pain.  Medicine to help relieve pain and inflammation (corticosteroids). This can be given by mouth (orally) or as an injection.  Assistive devices, such as a brace, wrap, splint, specialized glove, or cane.  Surgery, such as: ? An osteotomy. This is done to reposition the bones and relieve pain or to remove loose pieces of bone and cartilage. ? Joint replacement surgery. You may need this  surgery if you have very bad (advanced) osteoarthritis. Follow these instructions at home: Activity  Rest your affected joints as directed by your health care provider.  Do not drive or use heavy machinery while taking prescription pain medicine.  Exercise as directed. Your health care provider or physical therapist may recommend specific types of exercise, such as: ? Strengthening exercises. These are done to  strengthen the muscles that support joints that are affected by arthritis. They can be performed with weights or with exercise bands to add resistance. ? Aerobic activities. These are exercises, such as brisk walking or water aerobics, that get your heart pumping. ? Range-of-motion activities. These keep your joints easy to move. ? Balance and agility exercises. Managing pain, stiffness, and swelling      If directed, apply heat to the affected area as often as told by your health care provider. Use the heat source that your health care provider recommends, such as a moist heat pack or a heating pad. ? If you have a removable assistive device, remove it as told by your health care provider. ? Place a towel between your skin and the heat source. If your health care provider tells you to keep the assistive device on while you apply heat, place a towel between the assistive device and the heat source. ? Leave the heat on for 20-30 minutes. ? Remove the heat if your skin turns bright red. This is especially important if you are unable to feel pain, heat, or cold. You may have a greater risk of getting burned.  If directed, put ice on the affected joint: ? If you have a removable assistive device, remove it as told by your health care provider. ? Put ice in a plastic bag. ? Place a towel between your skin and the bag. If your health care provider tells you to keep the assistive device on during icing, place a towel between the assistive device and the bag. ? Leave the ice on for 20 minutes, 2-3 times a day. General instructions  Take over-the-counter and prescription medicines only as told by your health care provider.  Maintain a healthy weight. Follow instructions from your health care provider for weight control. These may include dietary restrictions.  Do not use any products that contain nicotine or tobacco, such as cigarettes and e-cigarettes. These can delay bone healing. If you need help  quitting, ask your health care provider.  Use assistive devices as directed by your health care provider.  Keep all follow-up visits as told by your health care provider. This is important. Where to find more information  [employer] of Arthritis and Musculoskeletal and Skin Diseases: www.niams.[employer].es  [employer] on Aging: http://kim-miller.com/  American College of Rheumatology: www.rheumatology.org Contact a health care provider if:  Your skin turns red.  You develop a rash.  You have pain that gets worse.  You have a fever along with joint or muscle aches. Get help right away if:  You lose a lot of weight.  You suddenly lose your appetite.  You have night sweats. Summary  Osteoarthritis is a type of arthritis that affects tissue covering the ends of bones in joints (cartilage).  This condition is caused by age-related wearing down of cartilage that covers the ends of bones.  The main symptom of this condition is pain, swelling, and stiffness in the joint.  There is no cure for this condition, but treatment can help to control pain and improve joint function. This information is not  intended to replace advice given to you by your health care provider. Make sure you discuss any questions you have with your health care provider. Document Revised: 02/05/2017 Document Reviewed: 10/28/2015 Elsevier Patient Education  [place].  Radicular Pain Radicular pain is a type of pain that spreads from your back or neck along a spinal nerve. Spinal nerves are nerves that leave the spinal cord and go to the muscles. Radicular pain is sometimes called radiculopathy, radiculitis, or a pinched nerve. When you have this type of pain, you may also have weakness, numbness, or tingling in the area of your body that is supplied by the nerve. The pain may feel sharp and burning. Depending on which spinal nerve is affected, the pain may occur in the:  Neck area (cervical  radicular pain). You may also feel pain, numbness, weakness, or tingling in the arms.  Mid-spine area (thoracic radicular pain). You would feel this pain in the back and chest. This type is rare.  Lower back area (lumbar radicular pain). You would feel this pain as low back pain. You may feel pain, numbness, weakness, or tingling in the buttocks or legs. Sciatica is a type of lumbar radicular pain that shoots down the back of the leg. Radicular pain occurs when one of the spinal nerves becomes irritated or squeezed (compressed). It is often caused by something pushing on a spinal nerve, such as one of the bones of the spine (vertebrae) or one of the round cushions between vertebrae (intervertebral disks). This can result from:  An injury.  Wear and tear or aging of a disk.  The growth of a bone spur that pushes on the nerve. Radicular pain often goes away when you follow instructions from your health care provider for relieving pain at home. Follow these instructions at home: Managing pain      If directed, put ice on the affected area: ? Put ice in a plastic bag. ? Place a towel between your skin and the bag. ? Leave the ice on for 20 minutes, 2-3 times a day.  If directed, apply heat to the affected area as often as told by your health care provider. Use the heat source that your health care provider recommends, such as a moist heat pack or a heating pad. ? Place a towel between your skin and the heat source. ? Leave the heat on for 20-30 minutes. ? Remove the heat if your skin turns bright red. This is especially important if you are unable to feel pain, heat, or cold. You may have a greater risk of getting burned. Activity   Do not sit or rest in bed for long periods of time.  Try to stay as active as possible. Ask your health care provider what type of exercise or activity is best for you.  Avoid activities that make your pain worse, such as bending and lifting.  Do not lift  anything that is heavier than 10 lb (4.5 kg), or the limit that you are told, until your health care provider says that it is safe.  Practice using proper technique when lifting items. Proper lifting technique involves bending your knees and rising up.  Do strength and range-of-motion exercises only as told by your health care provider or physical therapist. General instructions  Take over-the-counter and prescription medicines only as told by your health care provider.  Pay attention to any changes in your symptoms.  Keep all follow-up visits as told by your health care provider.  This is important. ? Your health care provider may send you to a physical therapist to help with this pain. Contact a health care provider if:  Your pain and other symptoms get worse.  Your pain medicine is not helping.  Your pain has not improved after a few weeks of home care.  You have a fever. Get help right away if:  You have severe pain, weakness, or numbness.  You have difficulty with bladder or bowel control. Summary  Radicular pain is a type of pain that spreads from your back or neck along a spinal nerve.  When you have radicular pain, you may also have weakness, numbness, or tingling in the area of your body that is supplied by the nerve.  The pain may feel sharp or burning.  Radicular pain may be treated with ice, heat, medicines, or physical therapy. This information is not intended to replace advice given to you by your health care provider. Make sure you discuss any questions you have with your health care provider. Document Revised: 09/07/2017 Document Reviewed: 09/07/2017 Elsevier Patient Education  [place].

## 2019-06-23 NOTE — Progress Notes (Signed)
Angela Grimes  06/23/2019  Body mass index is 28.34 kg/m.   HISTORY SECTION :  Chief Complaint  Patient presents with  . Hip Pain    right buttock painful    74 year old female presents for evaluation of painful right hip.  Her pain started in August 2019 started from the waist down went down both legs and then localized itself to the right groin at the superolateral apex of the groin sort of near the iliac crest.  She says she has trouble getting out of a car especially when she has to rotate her leg  The pain then switched over to the posterior aspect of the right buttock just at the end of the gluteus maximus but it does not radiate down the leg  She has not had any treatment she is diabetic and hypertensive  Her review of systems only complains of joint pain all the other systems were normal    ROS joint pain   has a past medical history of Chronic back pain, Diabetes mellitus without complication ([place]), Diverticulosis, Headache, Hypertension, and Lumbar radiculopathy.   Past Surgical History:  Procedure Laterality Date  . ABDOMINAL HYSTERECTOMY    . COLONOSCOPY     2012: left-side diverticula, single pedunculated polyp tubular adenoma  . COLONOSCOPY N/A 03/28/2015   Procedure: COLONOSCOPY;  Surgeon: Daneil Dolin, MD;  [facility]: [facility];  Service: Endoscopy;  Laterality: N/A;  1200pm - moved to 1:30 - office to notify  . THROAT SURGERY      Body mass index is 28.34 kg/m.   No Known Allergies   Current Outpatient Medications:  .  acetaminophen (TYLENOL) 500 MG tablet, Take 500 mg by mouth every 6 (six) hours as needed for mild pain. , Disp: , Rfl:  .  aspirin 81 MG tablet, Take 81 mg by mouth daily.  , Disp: , Rfl:  .  Calcium Carbonate Antacid (ANTACID PO), Take 1 tablet by mouth daily as needed (for acid reflux)., Disp: , Rfl:  .  cetirizine (ZYRTEC) 10 MG tablet, Take 10 mg by mouth as needed for allergies., Disp: , Rfl:  .  hydroxypropyl methylcellulose  / hypromellose (ISOPTO TEARS / GONIOVISC) 2.5 % ophthalmic solution, Place 1 drop into both eyes daily as needed for dry eyes. , Disp: , Rfl:  .  losartan-hydrochlorothiazide (HYZAAR) 100-25 MG tablet, Take 1 tablet by mouth daily., Disp: , Rfl:  .  metFORMIN (GLUCOPHAGE) 500 MG tablet, Take 1 tablet by mouth daily., Disp: , Rfl:  .  metoprolol succinate (TOPROL-XL) 25 MG 24 hr tablet, Take one pill every 12 hours if you have palpitaions, Disp: 30 tablet, Rfl: 0 .  potassium chloride SA (K-DUR,KLOR-CON) 20 MEQ tablet, Take 1 tablet (20 mEq total) by mouth daily., Disp: 3 tablet, Rfl: 0 .  gabapentin (NEURONTIN) 100 MG capsule, Take 1 capsule (100 mg total) by mouth 3 (three) times daily., Disp: 90 capsule, Rfl: 2   PHYSICAL EXAM SECTION: 1) BP (!) 154/85   Pulse 68   Ht 5\' 3"  (1.6 m)   Wt 160 lb (72.6 kg)   BMI 28.34 kg/m   Body mass index is 28.34 kg/m. General appearance: Well-developed well-nourished no gross deformities  2) Cardiovascular normal pulse and perfusion , normal color   3) Neurologically deep tendon reflexes are equal and normal, no sensation loss or deficits no pathologic reflexes  4) Psychological: Awake alert and oriented x3 mood and affect normal  5) Skin no lacerations or ulcerations no  nodularity no palpable masses, no erythema or nodularity  6) Musculoskeletal:   Gait remains normal  She has no tenderness anywhere in her lower back just in the end of the gluteus maximus sciatic nerve area  Her reflexes were 2+ and equal bilaterally at the knee 1+ at the ankle joint sharp touch and soft touch position senses were normal straight leg raises were negative  This [place] flexion adduction internal rotation testing was normal bilaterally for both hips and she had no straight leg raise findings.  MEDICAL DECISION MAKING  A.  Encounter Diagnoses  Name Primary?  . Right buttock pain Yes  . Primary osteoarthritis of right hip   . DDD (degenerative disc  disease), lumbar     B. DATA ANALYSED:  IMAGING: Independent interpretation of images: No true orders for x-rays 1 for her back which shows mild spondylosis L4-S1 with normal sagittal alignment but abnormal coronal alignment at L4-5 with a scoliosis there.  The disc spaces were maintained  The hip x-ray with pelvis including AP lateral right hip showed mild arthritis in the right hip primarily inferomedially.    Orders: Physical therapy  Outside records reviewed: Patient's doctor was kind enough to send [place] her records indicates that her hypertension is fairly well controlled her diabetes is not as her A1c is slightly elevated and needs to be work done.  8.7 previously 8.9.  Primary care doctor is Dr. Verlee [facility]setti at the [facility]  C. MANAGEMENT I do not think surgery is or needed here but therapy and gabapentin should be helpful for her  Meds ordered this encounter  Medications  . gabapentin (NEURONTIN) 100 MG capsule    Sig: Take 1 capsule (100 mg total) by mouth 3 (three) times daily.    Dispense:  90 capsule    Refill:  2   Follow-up as needed   Arther Abbott, MD  06/23/2019 11:56 AM

## 2020-01-25 ENCOUNTER — Other Ambulatory Visit (HOSPITAL_COMMUNITY): Payer: Self-pay | Admitting: Neurology

## 2020-01-25 DIAGNOSIS — Z1231 Encounter for screening mammogram for malignant neoplasm of breast: Secondary | ICD-10-CM

## 2020-03-04 ENCOUNTER — Other Ambulatory Visit: Payer: Self-pay

## 2020-03-04 ENCOUNTER — Ambulatory Visit (HOSPITAL_COMMUNITY)
Admission: RE | Admit: 2020-03-04 | Discharge: 2020-03-04 | Disposition: A | Discharge status: Home / Self Care | Payer: Medicare HMO | Source: Ambulatory Visit | Attending: Neurology | Admitting: Neurology

## 2020-03-04 DIAGNOSIS — Z1231 Encounter for screening mammogram for malignant neoplasm of breast: Secondary | ICD-10-CM | POA: Diagnosis present

## 2021-06-25 DIAGNOSIS — K8689 Other specified diseases of pancreas: Secondary | ICD-10-CM | POA: Insufficient documentation

## 2021-06-25 DIAGNOSIS — R634 Abnormal weight loss: Secondary | ICD-10-CM | POA: Insufficient documentation

## 2021-10-08 ENCOUNTER — Encounter: Payer: Self-pay | Admitting: *Deleted

## 2021-10-27 NOTE — Progress Notes (Unsigned)
Referring Provider: Abran Richard, MD Primary Care Physician:  Abran Richard, MD Primary Gastroenterologist:  Dr. Gala Romney  No chief complaint on file.   HPI:   Angela Grimes is a 76 y.o. female presenting today at the request of Abran Richard, MD for abdominal pain, pancreatic cyst.  Patient found to have pancreatic lesions and liver lesion on CT imaging in April 2023.  MRI/MRCP on 06/24/2021 with 1.9 cm pancreatic concurrent process and 3.4 cm distal pancreatic tail progressively heterogenously enhancing masses concerning for pancreatic neoplasm, 2.7 cm multi locular cystic lesion arising off the distal pancreatic tail abutting the distal pancreatic duct, additional multiple smaller cystic lesions within the pancreatic body and uncinate process measuring up to 1.3 cm, 1.4 cm liver lesion likely representing focal fat deposition or lipoma or less likely angiomyolipoma, also with small hepatic hemangioma, ill-defined stranding surrounding the right lateral aspect of the celiac and SMA which is poorly assessed.   EUS 06/26/2021 with Dr. Gayleen Orem: Impression: - A multicystic lesion was seen in the pancreatic                         tail, although the cyst does not have concerning                         features. Fine needle biopsy performed.                         - A unilocular cystic lesion without high-risk                         features was seen in the uncinate process of the                         pancreas.                         - There was no sign of significant pathology in the                         common bile duct.                         - One stone was visualized endosonographically in the                         gallbladder body.  Pathology: Fibrous stroma with bland epithelial proliferation, favor serous cystadenoma.    CA19-9 <1.20 on 06/25/21  Hospitalized 7/25 - 7/27 in [place].  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  She was placed on 81 mg aspirin and recommended considering CTA neck with and without contrast in 6 weeks for reevaluation.  She also had repeat CT A/P with contrast during this admission due to abdominal pain.  This showed enlarging hypoenhancing mass within the uncinate process/pancreatic head with vascular interfaces with the splenic vein, portal venous confluence, portal vein, hepatic artery, celiac and superior mesenteric arteries, highly concerning for malignancy.  Differential included cystic/necrotic adenocarcinoma versus cystic neuroendocrine.  Also with few hypoenhancing subcentimeter lesions in the liver not seen on prior study, too small to characterize.  The hypoattenuating lesion previously described within the  liver measuring 1.8 cm, adjacent to gallbladder fossa, less conspicuous on the study which could reflect a still focal nodular hyperplasia.  Recommended follow-up with PCP.  See in the ED in [place] again on 10/21/21 reporting worsening LLQ abdominal pain, generalized weakness, dizziness developing since March.  Found to have generalized TTP with exquisite TTP in LLQ and RLQ, hypoactive bowel sounds.  CT A/P with similar appearance of multiple pancreatic parenchymal lesions, dominant lesion in the pancreatic head/uncinate process causing narrowing of the distal splenic and superior mesenteric veins and proximal main portal vein, unchanged partial encasement of common hepatic artery.  No venous thrombosis. Similar appearance of a round hypodense lesion abutting the rectosigmoid colon, previously suspected to be a benign duplication versus peritoneal inclusion cyst.  CA 19-9 was 25.11. CBC wnl, CMP essentially normal except related elevated at 233.  Lipase normal. TSH normal at 0.019.  Free T3, free T4, total T4 within normal limits.   Today:      History of adenomatous colon polyps.  Last colonoscopy 03/28/2015 with normal exam, recommended  considering 1 more colonoscopy in 7 years if overall health permits.  Past Medical History:  Diagnosis Date   Chronic back pain    Diabetes mellitus without complication ([place])    Diverticulosis    pt denies   Headache    Hypertension    Lumbar radiculopathy     Past Surgical History:  Procedure Laterality Date   ABDOMINAL HYSTERECTOMY     COLONOSCOPY     2012: left-side diverticula, single pedunculated polyp tubular adenoma   COLONOSCOPY N/A 03/28/2015   Procedure: COLONOSCOPY;  Surgeon: Daneil Dolin, MD;  [facility];  Service: Endoscopy;  Laterality: N/A;  1200pm - moved to 1:30 - office to notify   THROAT SURGERY      Current Outpatient Medications  Medication Sig Dispense Refill   acetaminophen (TYLENOL) 500 MG tablet Take 500 mg by mouth every 6 (six) hours as needed for mild pain.      aspirin 81 MG tablet Take 81 mg by mouth daily.       Calcium Carbonate Antacid (ANTACID PO) Take 1 tablet by mouth daily as needed (for acid reflux).     cetirizine (ZYRTEC) 10 MG tablet Take 10 mg by mouth as needed for allergies.     gabapentin (NEURONTIN) 100 MG capsule Take 1 capsule (100 mg total) by mouth 3 (three) times daily. 90 capsule 2   hydroxypropyl methylcellulose / hypromellose (ISOPTO TEARS / GONIOVISC) 2.5 % ophthalmic solution Place 1 drop into both eyes daily as needed for dry eyes.      losartan-hydrochlorothiazide (HYZAAR) 100-25 MG tablet Take 1 tablet by mouth daily.     metFORMIN (GLUCOPHAGE) 500 MG tablet Take 1 tablet by mouth daily.     metoprolol succinate (TOPROL-XL) 25 MG 24 hr tablet Take one pill every 12 hours if you have palpitaions 30 tablet 0   potassium chloride SA (K-DUR,KLOR-CON) 20 MEQ tablet Take 1 tablet (20 mEq total) by mouth daily. 3 tablet 0   No current facility-administered medications for this visit.    Allergies as of 10/29/2021   (No Known Allergies)    Family History  Problem Relation Age of Onset   Stroke Mother     Diabetes Other    Colon cancer Neg Hx     Social History   Socioeconomic History   Marital status: Divorced    Spouse name: Not on file   Number of  children: Not on file   Years of education: Not on file   Highest education level: Not on file  Occupational History   Not on file  Tobacco Use   Smoking status: Never   Smokeless tobacco: Never  Substance and Sexual Activity   Alcohol use: No   Drug use: No   Sexual activity: Not on file  Other Topics Concern   Not on file  Social History Narrative   Not on file   Social Determinants of Health   Financial Resource Strain: Not on file  Food Insecurity: Not on file  Transportation Needs: Not on file  Physical Activity: Not on file  Stress: Not on file  Social Connections: Not on file  Intimate Partner Violence: Not on file    Review of Systems: Gen: Denies any fever, chills, cold or flulike symptoms, presyncope, syncope. CV: Denies chest pain, heart palpitations..  Resp: Denies shortness of breath, cough.  GI: See HPI GU : Denies urinary burning, urinary frequency, urinary hesitancy MS: Denies joint  Derm: Denies rash. Psych: Denies depression, anxiety. Heme: See HPI  Physical Exam: There were no vitals taken for this visit. General:   Alert and oriented. Pleasant and cooperative. Well-nourished and well-developed.  Head:  Normocephalic and atraumatic. Eyes:  Without icterus, sclera clear and conjunctiva pink.  Ears:  Normal auditory acuity. Lungs:  Clear to auscultation bilaterally. No wheezes, rales, or rhonchi. No distress.  Heart:  S1, S2 present without murmurs appreciated.  Abdomen:  +BS, soft, non-tender and non-distended. No HSM noted. No guarding or rebound. No masses appreciated.  Rectal:  Deferred  Msk:  Symmetrical without gross deformities. Normal posture. Extremities:  Without edema. Neurologic:  Alert and  oriented x4;  grossly normal neurologically. Skin:  Intact without significant lesions or  rashes. Psych:  Normal mood and affect.    Assessment:     Plan:  ***   Aliene Altes, PA-C [facility] 10/29/2021

## 2021-10-28 DIAGNOSIS — I7774 Dissection of vertebral artery: Secondary | ICD-10-CM | POA: Insufficient documentation

## 2021-10-28 DIAGNOSIS — E041 Nontoxic single thyroid nodule: Secondary | ICD-10-CM | POA: Insufficient documentation

## 2021-10-29 ENCOUNTER — Ambulatory Visit (INDEPENDENT_AMBULATORY_CARE_PROVIDER_SITE_OTHER): Payer: Medicare HMO | Admitting: Gastroenterology

## 2021-10-29 ENCOUNTER — Encounter: Payer: Self-pay | Admitting: Gastroenterology

## 2021-10-29 ENCOUNTER — Telehealth: Payer: Self-pay | Admitting: Gastroenterology

## 2021-10-29 VITALS — BP 146/83 | HR 96 | Temp 97.8°F | Ht 63.0 in | Wt 121.8 lb

## 2021-10-29 DIAGNOSIS — R110 Nausea: Secondary | ICD-10-CM

## 2021-10-29 DIAGNOSIS — R1030 Lower abdominal pain, unspecified: Secondary | ICD-10-CM

## 2021-10-29 DIAGNOSIS — K668 Other specified disorders of peritoneum: Secondary | ICD-10-CM

## 2021-10-29 DIAGNOSIS — K5900 Constipation, unspecified: Secondary | ICD-10-CM | POA: Diagnosis not present

## 2021-10-29 DIAGNOSIS — K862 Cyst of pancreas: Secondary | ICD-10-CM

## 2021-10-29 DIAGNOSIS — R634 Abnormal weight loss: Secondary | ICD-10-CM

## 2021-10-29 NOTE — Patient Instructions (Addendum)
We are placing a referral to [facility] for you.   To help prevent further weight loss, I recommend drinking 2 protein shakes/meal replacements daily in addition to trying to eat 3 meals a day as you can tolerate.   For constipation, stop dulcolax and start Linzess 145 mcg daily first thing in the morning at least 30 minutes before breakfast.  Call with a progress report in 1-2 weeks. If it works well, I will send in a prescription for you.   It was good to meet you today! If you have not herd anything from [facility] about scheduling an appointment in a couple of weeks, please let me know.   Aliene Altes, PA-C [facility]

## 2021-10-29 NOTE — Telephone Encounter (Signed)
Mindy/Tammy:   Please let patient know after further review of her chart and prior imaging studies, I would like to go ahead and arrange a special CT scan that looks at the blood vessels in her abdomen to ensure she doesn't have any significant vascular compromise while waiting to see [facility].   Please arrange CT angio A/P ASAP Dx: Enlarging pancreatic cyst with mesenteric stranding and vascular involvement, narrowing of SMV and portal vein, abdominal pain with postprandial symptoms, unintentional weight loss, nausea, early satiety.

## 2021-10-30 ENCOUNTER — Other Ambulatory Visit: Payer: Self-pay | Admitting: *Deleted

## 2021-10-30 ENCOUNTER — Encounter: Payer: Self-pay | Admitting: *Deleted

## 2021-10-30 DIAGNOSIS — R634 Abnormal weight loss: Secondary | ICD-10-CM

## 2021-10-30 DIAGNOSIS — R110 Nausea: Secondary | ICD-10-CM

## 2021-10-30 DIAGNOSIS — R1030 Lower abdominal pain, unspecified: Secondary | ICD-10-CM

## 2021-10-30 DIAGNOSIS — K862 Cyst of pancreas: Secondary | ICD-10-CM

## 2021-10-30 NOTE — Telephone Encounter (Signed)
Patient is scheduled for the CT angio A/P on 11/04/21 at 8:30. Pt instructed to arrive at 8 am and only liquids 4 hours prior to procedure.

## 2021-10-30 NOTE — Telephone Encounter (Signed)
PA approved via evicore. Auth# [id number], DOS: 10/30/21-04/28/2022

## 2021-10-30 NOTE — Telephone Encounter (Signed)
Noted  

## 2021-11-04 ENCOUNTER — Ambulatory Visit (HOSPITAL_COMMUNITY): Admission: RE | Admit: 2021-11-04 | Payer: Medicare HMO | Source: Ambulatory Visit

## 2021-11-08 IMAGING — MG DIGITAL SCREENING BILAT W/ TOMO W/ CAD
6 of 10 series · 6 of 30 positions shown · non-contrast
Comparison: Previous exam(s).

CLINICAL DATA: Screening.

EXAM:
DIGITAL SCREENING BILATERAL MAMMOGRAM WITH TOMO AND CAD

[L CC synth-2D]
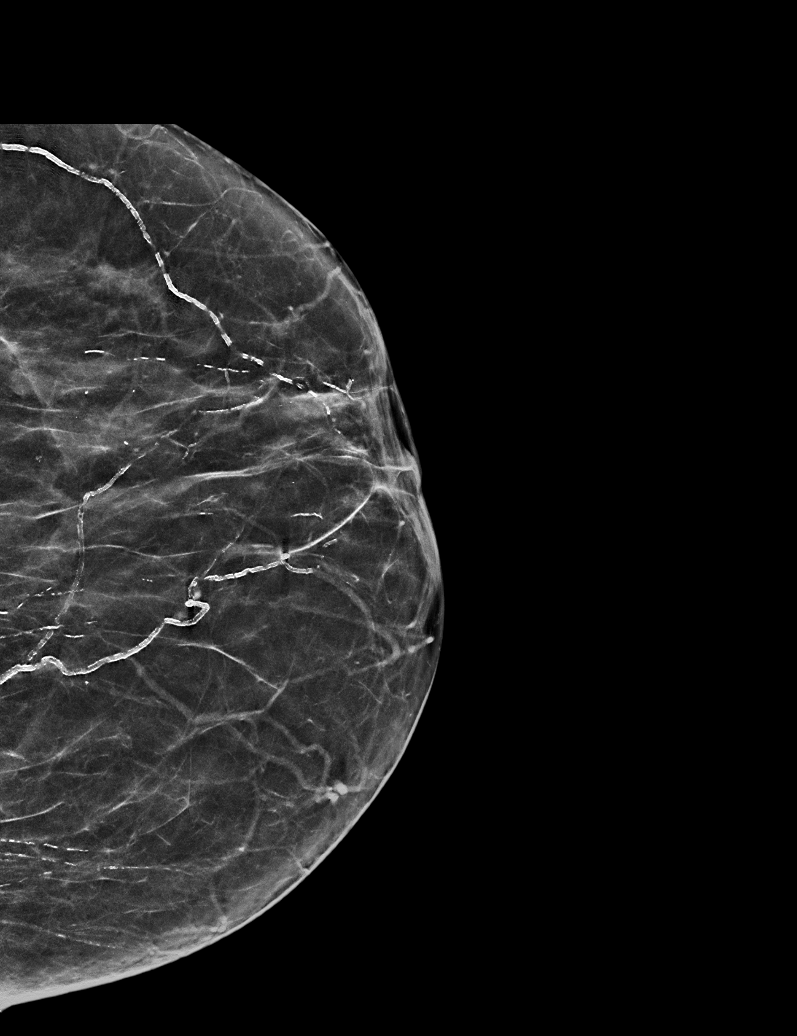

[R MLO synth-2D (1 of 2)]
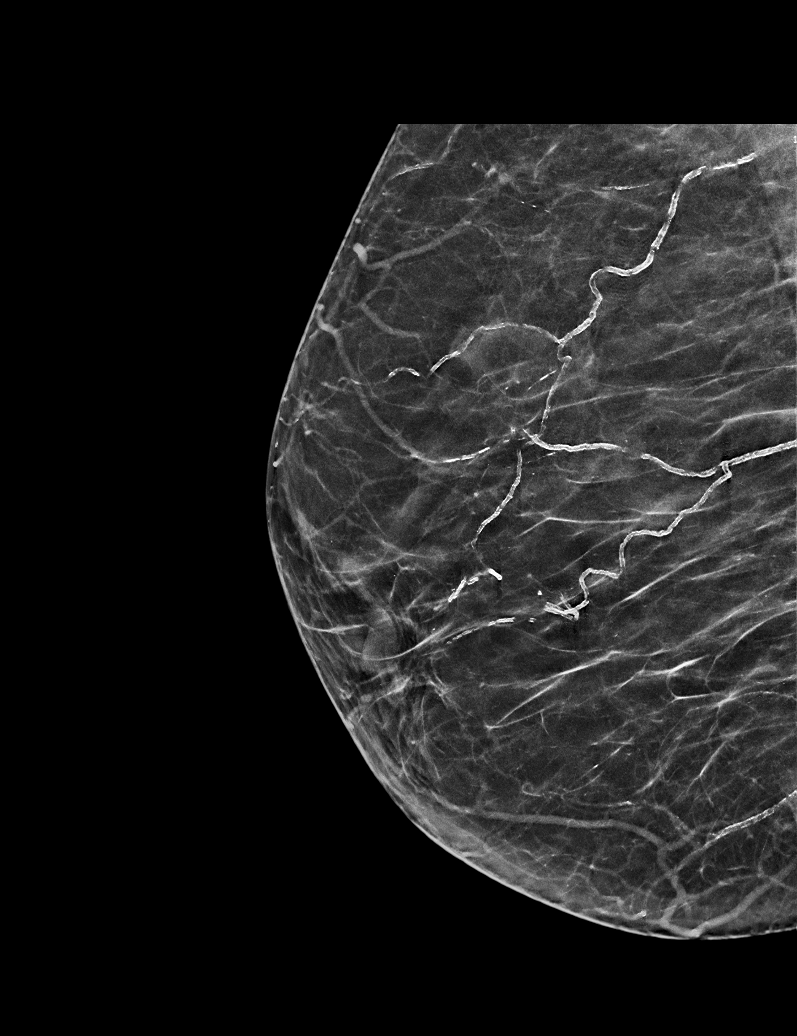

[L MLO synth-2D]
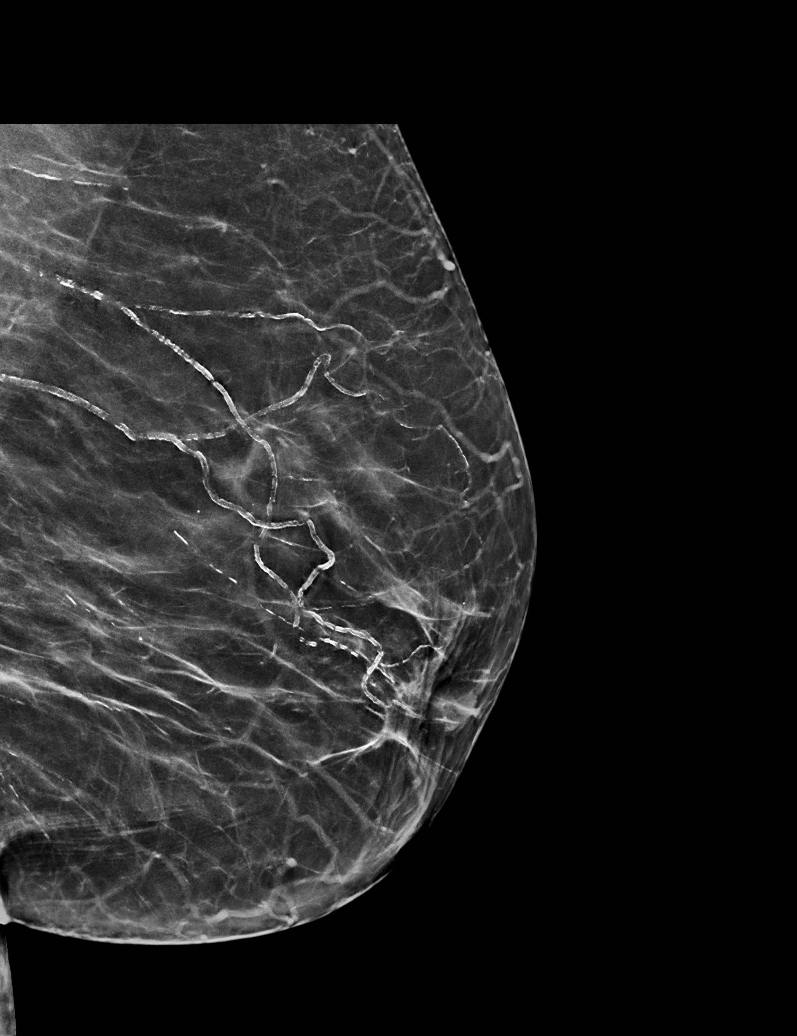

[R MLO synth-2D (2 of 2)]
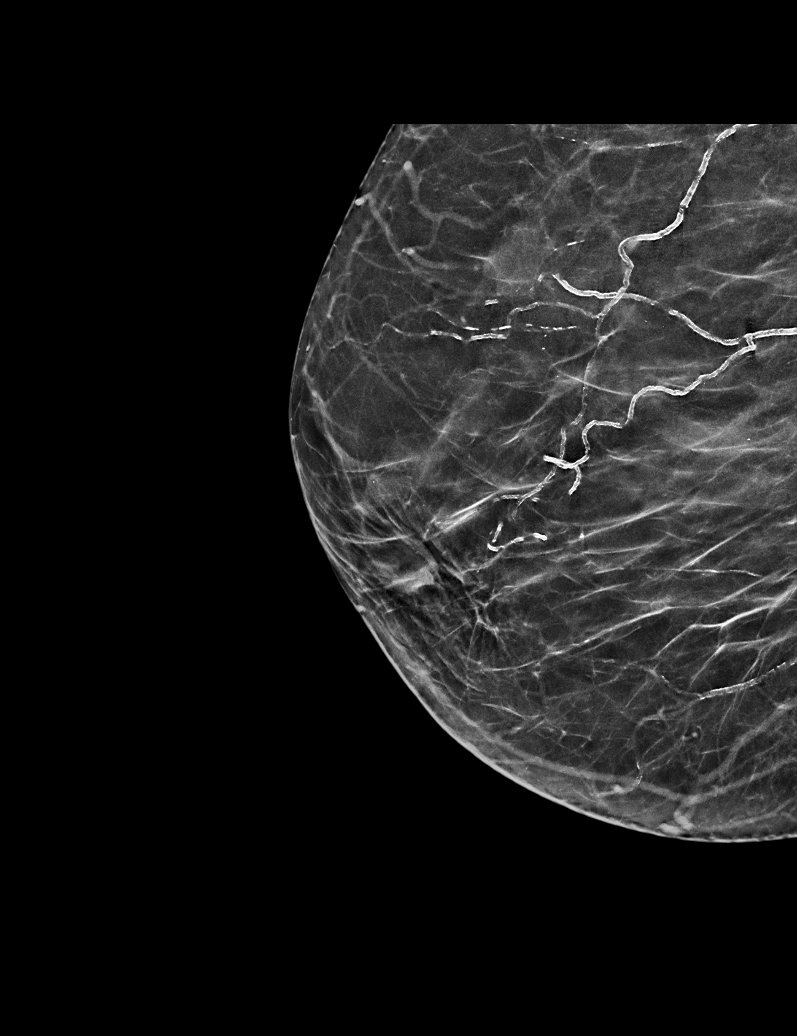

[R CC synth-2D]
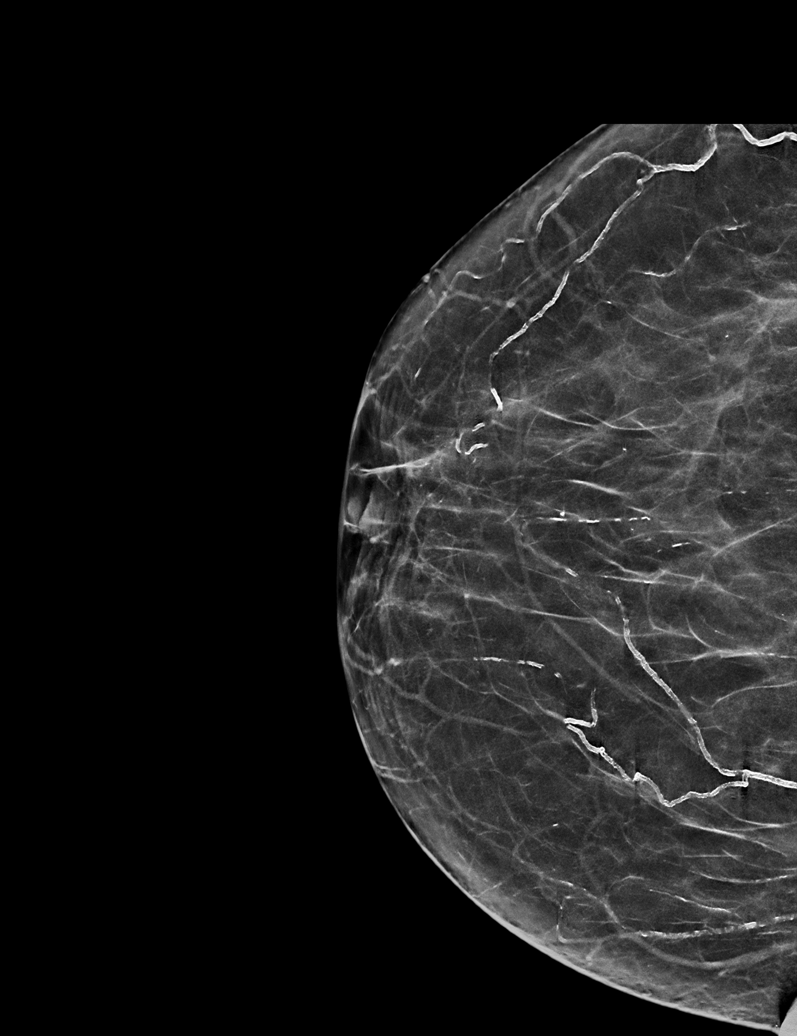

[R MLO tomo · tomo slice 29/56.0]
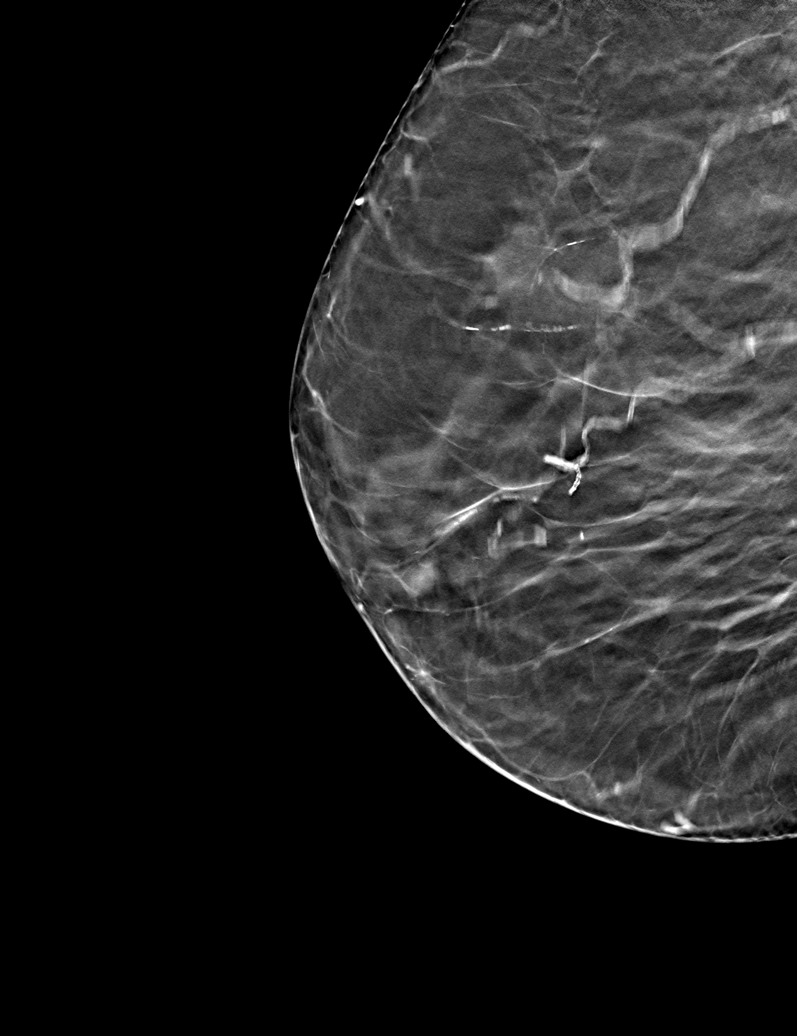

[6 of 30 positions shown; findings below may reference images not displayed]

ACR Breast Density Category b: There are scattered areas of
fibroglandular density.
FINDINGS: There are no findings suspicious for malignancy. Images were
processed with CAD.
IMPRESSION: No mammographic evidence of malignancy. A result letter of this
screening mammogram will be mailed directly to the patient.

RECOMMENDATION:
Screening mammogram in one year. (Code:[id number])

BI-RADS CATEGORY  1: Negative.

## 2021-11-11 DIAGNOSIS — C259 Malignant neoplasm of pancreas, unspecified: Secondary | ICD-10-CM | POA: Insufficient documentation

## 2021-11-20 DIAGNOSIS — Z79899 Other long term (current) drug therapy: Secondary | ICD-10-CM | POA: Insufficient documentation

## 2021-11-20 DIAGNOSIS — M899 Disorder of bone, unspecified: Secondary | ICD-10-CM | POA: Insufficient documentation

## 2021-11-20 DIAGNOSIS — Z789 Other specified health status: Secondary | ICD-10-CM | POA: Insufficient documentation

## 2021-11-20 DIAGNOSIS — G894 Chronic pain syndrome: Secondary | ICD-10-CM | POA: Insufficient documentation

## 2021-11-20 NOTE — Progress Notes (Unsigned)
Patient: Angela Grimes  Service Category: Precharting review  Provider: Gaspar Cola, MD  DOB: Mar 02, 1946  DOS: 11/24/2021  Referring Provider: Abran Richard, MD  MRN: [id number]  Setting: Ambulatory outpatient  PCP: Abran Richard, MD  Type: New Patient  Specialty: Interventional Pain Management           NO-SHOW to initial evaluation (11/24/2021)  Historic Controlled Substance Pharmacotherapy Review  PMP and historical list of controlled substances: Morphine sulfate 100 mg /5 mL (30 mL), 120 mg/day (120 MME) (last filled on 11/13/2021); lorazepam 0.5 mg tablet, 1 tab p.o. 4 times daily; fentanyl 25 mcg/h patch (# 5) (last filled on 11/13/2021); oxycodone/APAP 5/325 (# 180), 1 tab p.o. every 4 hours (last filled on 11/11/2021); oxycodone IR 5 mg tablet (# 24), 1 tab p.o. 4 times daily (# 15) (last filled on 10/24/2021); tramadol 50 mg tablet, half a tablet p.o. daily (# 15) (last filled on 10/14/2021); hydrocodone/APAP 5/325, 1 tab p.o. 4 times daily (# 20) (last filled on 07/28/2021 Current opioid analgesics: Morphine sulfate 100 mg /5 mL (30 mL), 120 mg/day (120 MME) (last filled on 11/13/2021); fentanyl 25 mcg/h patch (#5) (last filled on 11/13/2021) (60 MME) MME/day: 180 mg/day  Historical Background Evaluation: [place] PMP: PDMP reviewed during this encounter.  Abnormal pattern detected. Pattern of multiple prescribers found PMP NARX Score Report:  Narcotic: 552 Sedative: 351 Stimulant: 000  Risk Assessment Profile: PMP NARX Overdose Risk Score: 540  Meds   Current Outpatient Medications:    acetaminophen (TYLENOL) 500 MG tablet, Take 500 mg by mouth every 6 (six) hours as needed for mild pain. , Disp: , Rfl:    aspirin 81 MG tablet, Take 81 mg by mouth daily.  , Disp: , Rfl:    Calcium Carbonate Antacid (ANTACID PO), Take 1 tablet by mouth daily as needed (for acid reflux)., Disp: , Rfl:    LEVEMIR FLEXPEN 100 UNIT/ML FlexPen, Inject 10 Units into the skin at bedtime., Disp: , Rfl:    meclizine  (ANTIVERT) 25 MG tablet, Take 12.5 mg by mouth 3 (three) times daily as needed., Disp: , Rfl:    metoprolol succinate (TOPROL-XL) 25 MG 24 hr tablet, Take one pill every 12 hours if you have palpitaions, Disp: 30 tablet, Rfl: 0  Imaging Review  Lumbosacral Imaging: Lumbar MR wo contrast: Results for orders placed during the hospital encounter of 09/22/04 MR Lumbar Spine Wo Contrast  Narrative History: Low back pain radiating to left leg  MRI LUMBAR SPINE WITHOUT CONTRAST:  Axial and sagittal noncontrast MR images lumbar spine without priors for comparison. Five lumbar radiographs by x-rays of 09/22/2004.  Disc desiccation throughout lumbar spine. No significant marrow signal abnormalities. Tip of conus medullaris at L1. No masses in spinal canal, evidence of prior surgery, or local retroperitoneal abnormality.  T12-L1, L1-L2: Minimally bulging discs  L2-L3: Diffuse disc bulge flattening thecal sac. Minimal ligamentum flavum thickening. No focal disc herniation.  L3-L4: Moderate sized left foraminal disc herniation with mass-effect upon exiting left L3 root. Additionally, mild mass-effect upon left ventrolateral aspect of thecal sac and left L4 root. Bilateral facet degenerative changes and mild ligamentum flavum thickening.  L4-L5: Small left foraminal disc herniation, without definite mass-effect upon exiting left L4 root. Mild flattening of thecal sac with mild mass-effect upon left L5 root. Bilateral facet hypertrophy and ligamentum flavum thickening. No right-sided neural compression, although mild narrowing of right lateral recess noted.  L5-S1: No significant abnormalities.  IMPRESSION: Moderate sized left foraminal and extraforaminal  disc herniation L3-L4 with mass-effect upon left L3 and L4 roots. Left foraminal disc herniation L4-L5 with mild mass-effect upon left L5 root. Clinical correlation for symptoms in above neural distributions recommended. Multilevel  additional diffuse degenerative disc and facet disease changes lumbar spine.  Provider: Annabell Sabal  Lumbar DG 2-3 views: Results for orders placed in visit on 06/23/19 DG Lumbar Spine 2-3 Views  Narrative Imaging [facility]  Back pain  2 3 views lumbar spine  Slight increased lordosis on the sagittal alignment the disc spaces primarily remain intact there is mild L4 on L5 grade 1 spondylolisthesis with corresponding facet arthritis from L4-S1  She also has a imbalance on the [facility] x-ray as the pelvis is shifted to the right with a higher right hemipelvis than left and an acute angulation between L4 and L5  Impression L4-5 acute angle scoliosis with L4-S1 spondylosis and grade 1 spondylolisthesis L4 on 5  Lumbar DG (Complete) 4+V: Results for orders placed during the hospital encounter of 09/22/04 DG Lumbar Spine Complete  Narrative History: Low back pain, radiculopathy, pain left leg with numbness  LUMBAR SPINE 4 VIEWS:  Five lumbar vertebra. No fracture or subluxation. Hemisacralization of left transverse process L5 with assimilation joint. Vertebral body heights maintained. No spondylolysis or bone destruction.  IMPRESSION: No acute abnormalities.  Provider: Jennye Boroughs  Hip Imaging: Hip-R DG 2-3 views: Results for orders placed during the hospital encounter of 05/22/19 DG HIP UNILAT WITH PELVIS 2-3 VIEWS RIGHT  Narrative CLINICAL DATA:  Right hip pain for 6 months  EXAM: DG HIP (WITH OR WITHOUT PELVIS) 2-3V RIGHT  COMPARISON:  None.  FINDINGS: No evidence of hip fracture or traumatic malalignment. There is moderate bilateral hip arthrosis with periarticular spurring. Corticated fragment adjacent the greater trochanter may reflect prior avulsion type injury or enthesopathic change. Additional mild degenerative changes noted the SI joints. Partial lumbarization of the L5 transverse process with pseudoarticulation with the adjacent left sacral  ala. Remaining osseous structures are unremarkable. Soft tissues are free of acute abnormality. Few calcified phleboliths in the pelvis. Vascular calcium in the medial thighs.  IMPRESSION: 1. No acute osseous abnormality. 2. Moderate bilateral hip arthrosis. 3. Enthesopathic change versus remote avulsion of the right greater trochanter. 4. Partial lumbarization of the L5 transverse process with pseudoarticulation with the adjacent left sacral ala.   Electronically Signed By: Lovena Le M.D. On: 05/23/2019 00:50  Complexity Note: Imaging results reviewed.                         Allergies  Ms. Fangman has No Known Allergies.  Laboratory Chemistry Profile   Renal Lab Results  Component Value Date   BUN 20 10/12/2015   CREATININE 0.71 10/12/2015   GFRAA >60 10/12/2015   GFRNONAA >60 10/12/2015     Electrolytes Lab Results  Component Value Date   NA 134 (L) 10/12/2015   K 2.8 (L) 10/12/2015   CL 100 (L) 10/12/2015   CALCIUM 9.2 10/12/2015     Hepatic Lab Results  Component Value Date   AST 16 10/12/2015   ALT 13 (L) 10/12/2015   ALBUMIN 4.0 10/12/2015   ALKPHOS 97 10/12/2015     ID No results found for: "LYMEIGGIGMAB", "HIV", "SARSCOV2NAA", "STAPHAUREUS", "MRSAPCR", "HCVAB", "PREGTESTUR", "RMSFIGG", "QFVRPH1IGG", "QFVRPH2IGG"   Bone No results found for: "VD25OH", "VH846NG2XBM", "WU1324MW1", "UU7253GU4", "25OHVITD1", "25OHVITD2", "40HKVQQV9", "TESTOFREE", "TESTOSTERONE"   Endocrine Lab Results  Component Value Date   GLUCOSE 176 (H) 10/12/2015  Neuropathy No results found for: "VITAMINB12", "FOLATE", "HGBA1C", "HIV"   CNS No results found for: "COLORCSF", "APPEARCSF", "RBCCOUNTCSF", "WBCCSF", "POLYSCSF", "LYMPHSCSF", "EOSCSF", "PROTEINCSF", "GLUCCSF", "JCVIRUS", "CSFOLI", "IGGCSF", "LABACHR", "ACETBL"   Inflammation (CRP: Acute  ESR: Chronic) No results found for: "CRP", "ESRSEDRATE", "LATICACIDVEN"   Rheumatology No results found for: "RF",  "ANA", "LABURIC", "URICUR", "LYMEIGGIGMAB", "LYMEABIGMQN", "HLAB27"   Coagulation Lab Results  Component Value Date   PLT 384 10/12/2015     Cardiovascular Lab Results  Component Value Date   TROPONINI <0.03 10/12/2015   HGB 12.0 10/12/2015   HCT 36.8 10/12/2015     Screening No results found for: "SARSCOV2NAA", "COVIDSOURCE", "STAPHAUREUS", "MRSAPCR", "HCVAB", "HIV", "PREGTESTUR"   Cancer No results found for: "CEA", "CA125", "LABCA2"   Allergens No results found for: "ALMOND", "APPLE", "ASPARAGUS", "AVOCADO", "BANANA", "BARLEY", "BASIL", "BAYLEAF", "GREENBEAN", "LIMABEAN", "WHITEBEAN", "BEEFIGE", "REDBEET", "BLUEBERRY", "BROCCOLI", "CABBAGE", "MELON", "CARROT", "CASEIN", "CASHEWNUT", "CAULIFLOWER", "CELERY"     Note: Lab results reviewed.  Ross   Past Surgical History:  Procedure Laterality Date   ABDOMINAL HYSTERECTOMY     COLONOSCOPY     2012: left-side diverticula, single pedunculated polyp tubular adenoma   COLONOSCOPY N/A 03/28/2015   Procedure: COLONOSCOPY;  Surgeon: Daneil Dolin, MD;  Location: [facility];  Service: Endoscopy;  Laterality: N/A;  1200pm - moved to 1:30 - office to notify   THROAT SURGERY     Active Ambulatory Problems    Diagnosis Date Noted   History of adenomatous polyp of colon 03/20/2015   Encounter for screening colonoscopy 03/20/2015   Anxiety 07/07/2016   Chest pain 07/07/2016   Diabetes mellitus, type 2 ([place]) 07/07/2016   Hypertension associated with diabetes ([place]) 07/07/2016   Hyperlipidemia 07/07/2016   Palpitations 07/07/2016   Pancreatic mass 06/25/2021   Lower abdominal pain 10/29/2021   Peritoneal cyst 10/29/2021   Constipation 10/29/2021   Loss of weight 10/29/2021   Nausea without vomiting 10/29/2021   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   Thyroid nodule 10/28/2021   Unexplained weight loss 06/25/2021   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

## 2021-11-24 ENCOUNTER — Ambulatory Visit (HOSPITAL_BASED_OUTPATIENT_CLINIC_OR_DEPARTMENT_OTHER): Payer: Medicare HMO | Admitting: Pain Medicine

## 2021-11-24 DIAGNOSIS — Z91199 Patient's noncompliance with other medical treatment and regimen due to unspecified reason: Secondary | ICD-10-CM

## 2021-11-24 DIAGNOSIS — G894 Chronic pain syndrome: Secondary | ICD-10-CM

## 2021-11-24 DIAGNOSIS — M899 Disorder of bone, unspecified: Secondary | ICD-10-CM

## 2021-11-24 DIAGNOSIS — Z79899 Other long term (current) drug therapy: Secondary | ICD-10-CM

## 2021-11-24 DIAGNOSIS — Z789 Other specified health status: Secondary | ICD-10-CM

## 2021-12-07 DEATH — deceased

## 2022-03-19 ENCOUNTER — Encounter: Payer: Self-pay | Admitting: Internal Medicine
